# Patient Record
Sex: Male | Born: 1965 | Race: White | Hispanic: No | Smoking: Never smoker
Health system: Southern US, Community
[De-identification: ages and names within clinical notes are randomized; demographics above are authoritative.]

---

## 1998-08-24 ENCOUNTER — Encounter: Payer: Self-pay | Admitting: Emergency Medicine

## 1998-08-24 ENCOUNTER — Emergency Department (HOSPITAL_COMMUNITY): Admission: EM | Admit: 1998-08-24 | Discharge: 1998-08-24 | Payer: Self-pay | Admitting: Emergency Medicine

## 2009-06-29 ENCOUNTER — Ambulatory Visit (HOSPITAL_COMMUNITY): Admission: RE | Admit: 2009-06-29 | Discharge: 2009-06-29 | Payer: Self-pay | Admitting: General Surgery

## 2010-04-18 LAB — CBC
HCT: 47.2 % (ref 39.0–52.0)
Hemoglobin: 16 g/dL (ref 13.0–17.0)
MCHC: 34 g/dL (ref 30.0–36.0)
MCV: 88.7 fL (ref 78.0–100.0)
Platelets: 226 10*3/uL (ref 150–400)
RBC: 5.32 MIL/uL (ref 4.22–5.81)
RDW: 13.8 % (ref 11.5–15.5)
WBC: 5.7 10*3/uL (ref 4.0–10.5)

## 2010-04-18 LAB — DIFFERENTIAL
Basophils Absolute: 0 10*3/uL (ref 0.0–0.1)
Basophils Relative: 0 % (ref 0–1)
Eosinophils Absolute: 0.1 10*3/uL (ref 0.0–0.7)
Eosinophils Relative: 2 % (ref 0–5)
Lymphocytes Relative: 24 % (ref 12–46)
Lymphs Abs: 1.4 10*3/uL (ref 0.7–4.0)
Monocytes Absolute: 0.4 10*3/uL (ref 0.1–1.0)
Monocytes Relative: 8 % (ref 3–12)
Neutro Abs: 3.8 10*3/uL (ref 1.7–7.7)
Neutrophils Relative %: 66 % (ref 43–77)

## 2019-03-21 ENCOUNTER — Institutional Professional Consult (permissible substitution): Payer: Self-pay | Admitting: Internal Medicine

## 2019-04-03 ENCOUNTER — Ambulatory Visit (INDEPENDENT_AMBULATORY_CARE_PROVIDER_SITE_OTHER): Payer: 59 | Admitting: Internal Medicine

## 2019-04-03 ENCOUNTER — Other Ambulatory Visit: Payer: 59

## 2019-04-03 ENCOUNTER — Other Ambulatory Visit: Payer: Self-pay

## 2019-04-03 ENCOUNTER — Encounter: Payer: Self-pay | Admitting: Internal Medicine

## 2019-04-03 DIAGNOSIS — R05 Cough: Secondary | ICD-10-CM | POA: Diagnosis not present

## 2019-04-03 DIAGNOSIS — R0600 Dyspnea, unspecified: Secondary | ICD-10-CM | POA: Diagnosis not present

## 2019-04-03 DIAGNOSIS — R0609 Other forms of dyspnea: Secondary | ICD-10-CM

## 2019-04-03 DIAGNOSIS — R053 Chronic cough: Secondary | ICD-10-CM

## 2019-04-03 LAB — BASIC METABOLIC PANEL
BUN: 19 mg/dL (ref 6–23)
CO2: 27 mEq/L (ref 19–32)
Calcium: 9.4 mg/dL (ref 8.4–10.5)
Chloride: 105 mEq/L (ref 96–112)
Creatinine, Ser: 0.84 mg/dL (ref 0.40–1.50)
GFR: 94.76 mL/min (ref >60.00)
Glucose, Bld: 162 mg/dL — ABNORMAL HIGH (ref 70–99)
Potassium: 4.3 mEq/L (ref 3.5–5.1)
Sodium: 137 mEq/L (ref 135–145)

## 2019-04-03 LAB — BRAIN NATRIURETIC PEPTIDE: Pro B Natriuretic peptide (BNP): 25 pg/mL (ref 0.0–100.0)

## 2019-04-03 LAB — SEDIMENTATION RATE: Sed Rate: 20 mm/hr (ref 0–20)

## 2019-04-03 LAB — SARS-COV-2 IGG: SARS-COV-2 IgG: 0.02

## 2019-04-03 NOTE — Patient Instructions (Addendum)
Finish prednisone as you  plan   Change  prilosec 40mg  to where you  Take 30-60 min before first meal of the day and Pepcid ac (famotidine) 20 mg one after supper  until cough is completely gone for at least a week without the need for cough suppression  GERD (REFLUX)  is an extremely common cause of respiratory symptoms just like yours , many times with no obvious heartburn at all.    It can be treated with medication, but also with lifestyle changes including elevation of the head of your bed (ideally with 6 -8inch blocks under the headboard of your bed),  Smoking cessation, avoidance of late meals, excessive alcohol, and avoid fatty foods, chocolate, peppermint, colas, red wine, and acidic juices such as orange juice.  NO MINT OR MENTHOL PRODUCTS SO NO COUGH DROPS  USE SUGARLESS CANDY INSTEAD (Jolley ranchers or Stover's or Life Savers) or even ice chips will also do - the key is to swallow to prevent all throat clearing. NO OIL BASED VITAMINS - use powdered substitutes.  Avoid fish oil when coughing.  Go to 520 for your labs in the basement   Please schedule a follow up office visit in 2 weeks, sooner if needed

## 2019-04-03 NOTE — Progress Notes (Signed)
Travis Stewart, male    DOB: May 23, 1965,  MRN: [id number]   Brief patient profile:  54 yo male new onset nonproductive  cough mid Jan 2021 assoc with subjective wheeze and worse doe worse cough supine rx since 04/01/2019 referred to pulmonary clinic 04/03/2019 by Dr   Joneen Boers   History of Present Illness  04/03/2019  Pulmonary/ 1st office eval/Travis Stewart  Was maintained on prilosec 40 mg with breakfast  Chief Complaint  Patient presents with  . Pulmonary Consult    Referred by Dr. Smith Mince. Pt c/o DOE x 2 months. He states he coughs all the time, worse when lies down and feels sputum in his throat.   Dyspnea:  MMRC1 = can walk nl pace, flat grade, can't hurry or go uphills or steps s sob   Cough: improved on prednisone Sleep: now hob up 60 degrees recliner whereas prev sleeping flat  SABA use: no   No past medical history on file.  Outpatient Medications Prior to Visit  Medication Sig Dispense Refill  . atorvastatin (LIPITOR) 20 MG tablet Take 20 mg by mouth daily.    Marland Kitchen omeprazole (PRILOSEC) 40 MG capsule Take 40 mg by mouth daily.    . predniSONE (DELTASONE) 20 MG tablet Taper as directed        Objective:     BP 140/80 (BP Location: Left Arm, Cuff Size: Normal)   Pulse 86   Temp (!) 97.3 F (36.3 C) (Temporal)   Ht 6' (1.829 m)   Wt 262 lb (118.8 kg)   SpO2 96% Comment: on RA  BMI 35.53 kg/m   SpO2: 96 %(on RA)  amb pleasant wm nad   HEENT : pt wearing mask not removed for exam due to covid -19 concerns.    NECK :  without JVD/Nodes/TM/ nl carotid upstrokes bilaterally   LUNGS: no acc muscle use,  Nl contour chest which is clear to A and P bilaterally without cough on insp or exp maneuvers   CV:  RRR  no s3 or murmur or increase in P2, and no edema   ABD:  soft and nontender with nl inspiratory excursion in the supine position. No bruits or organomegaly appreciated, bowel sounds nl  MS:  Nl gait/ ext warm without deformities, calf tenderness, cyanosis or  clubbing No obvious joint restrictions   SKIN: warm and dry without lesions    NEURO:  alert, approp, nl sensorium with  no motor or cerebellar deficits apparent.      I personally reviewed images and agree with radiology impression as follows:  CXR:   03/18/19  Chronic interstitial changes. No focal pulmonary infiltrate, pleural effusion or pneumothorax.  The cardiomediastinal silhouette is normal in size and contour.   Labs ordered/ reviewed:      Chemistry      Component Value Date/Time   NA 137 04/03/2019 1636   K 4.3 04/03/2019 1636   CL 105 04/03/2019 1636   CO2 27 04/03/2019 1636   BUN 19 04/03/2019 1636   CREATININE 0.84 04/03/2019 1636      Component Value Date/Time   CALCIUM 9.4 04/03/2019 1636           Lab Results  Component Value Date   DDIMER 0.40 04/03/2019         Lab Results  Component Value Date   PROBNP 25.0 04/03/2019       Lab Results  Component Value Date   ESRSEDRATE 20 04/03/2019  Covid IgG   04/03/2019   = 0.20 (neg)  Assessment   DOE (dyspnea on exertion) Onset Mid Jan 2021 with cough then sob with ? ILD on cxr  - Pred rx 04/01/19 >>> clinically improved  - 04/03/2019   Walked RA x two laps =  approx 548f @ fast pace - stopped due to end of study sob   with sats of 94 % at the end of the study.  DDx for diffuse pulmonary infiltrates  Miscellaneous:Alv microlithiasis, alv proteinosis, asp, bronchiectais, BOOP (less likely with nl esr but note aleady on prednisone when obtained)   ARDS/ AIP Occupational dz/ HSP Neoplasm Infection esp viral  Drug  Pulmonary emboli, Protein disorders Edema/Eosinophilic dz (less likely with perip Eos 0.1 03/18/19 prior to pred) Sarcoidosis Connective tissue dz not yet declared ie sometimes ILD is this presenting problem Hist X / Hemorrhage Idiopathic   This would be a very unusual (rapid onset)  presentation for idiopathic fibrosis and since pt is already feeling better on prednisone (ESR  not as helpful since already  On prednisone) would be more inclined to think this a more benign and hopefully self limited inflammatory injury like we see with COVID 19 though Ab does not support this particular virus.    >>> rec f/u in 2 weeks to regroup.       Chronic cough Onset Mid Jan 2021 while on ppi with bfast daily  - 04/03/2019 rx max gerd rx / pred taper off   Of the three most common causes of  Sub-acute / recurrent or chronic cough, only one (GERD)  can actually contribute to/ trigger  the other two (asthma and post nasal drip syndrome)  and perpetuate the cylce of cough.  While not intuitively obvious, many patients with chronic low grade reflux do not cough until there is a primary insult that disturbs the protective epithelial barrier and exposes sensitive nerve endings.   This is typically viral but can due to PNDS and  either may apply here.    >>> The point is that once this occurs, it is difficult to eliminate the cycle  using anything but a maximally effective acid suppression regimen at least in the short run, accompanied by an appropriate diet to address non acid GERD and  Finish  Prednisone in case of component of Th-2 driven upper or lower airways inflammation (if cough responds short term only to relapse before return while still  on rx for uacs that might point to other issues like  allergic rhinitis/ asthma or eos bronchitis)     Each maintenance medication was reviewed in detail including emphasizing most importantly the difference between maintenance and prns and under what circumstances the prns are to be triggered using an action plan format where appropriate.  Total time for H and P, chart review, counseling,  directly observing portions of ambulatory 02 saturation study/  and generating customized AVS unique to this office visit / charting = 45 min        MChristinia Gully MD 04/03/2019

## 2019-04-04 ENCOUNTER — Encounter: Payer: Self-pay | Admitting: Internal Medicine

## 2019-04-04 DIAGNOSIS — R053 Chronic cough: Secondary | ICD-10-CM | POA: Insufficient documentation

## 2019-04-04 DIAGNOSIS — R05 Cough: Secondary | ICD-10-CM | POA: Insufficient documentation

## 2019-04-04 LAB — D-DIMER, QUANTITATIVE: D-Dimer, Quant: 0.4 mcg/mL FEU (ref <0.50)

## 2019-04-04 NOTE — Assessment & Plan Note (Signed)
Onset Mid Jan 2021 with cough then sob with ? ILD on cxr  - Pred rx 04/01/19 >>> clinically improved  - 04/03/2019   Walked RA x two laps =  approx 500ft @ fast pace - stopped due to end of study sob   with sats of 94 % at the end of the study.  DDx for diffuse pulmonary infiltrates  Miscellaneous:Alv microlithiasis, alv proteinosis, asp, bronchiectais, BOOP (less likely with nl esr but note aleady on prednisone when obtained)   ARDS/ AIP Occupational dz/ HSP Neoplasm Infection esp viral  Drug  Pulmonary emboli, Protein disorders Edema/Eosinophilic dz (less likely with perip Eos 0.1 03/18/19 prior to pred) Sarcoidosis Connective tissue dz not yet declared ie sometimes ILD is this presenting problem Hist X / Hemorrhage Idiopathic   This would be a very unusual (rapid onset)  presentation for idiopathic fibrosis and since pt is already feeling better on prednisone (ESR not as helpful since already  On prednisone) would be more inclined to think this a more benign and hopefully self limited inflammatory injury like we see with COVID 19 though Ab does not support this particular virus.    >>> rec f/u in 2 weeks to regroup.  

## 2019-04-04 NOTE — Assessment & Plan Note (Signed)
Onset Mid Jan 2021 while on ppi with bfast daily  - 04/03/2019 rx max gerd rx / pred taper off   Of the three most common causes of  Sub-acute / recurrent or chronic cough, only one (GERD)  can actually contribute to/ trigger  the other two (asthma and post nasal drip syndrome)  and perpetuate the cylce of cough.  While not intuitively obvious, many patients with chronic low grade reflux do not cough until there is a primary insult that disturbs the protective epithelial barrier and exposes sensitive nerve endings.   This is typically viral but can due to PNDS and  either may apply here.    >>> The point is that once this occurs, it is difficult to eliminate the cycle  using anything but a maximally effective acid suppression regimen at least in the short run, accompanied by an appropriate diet to address non acid GERD and  Finish  Prednisone in case of component of Th-2 driven upper or lower airways inflammation (if cough responds short term only to relapse before return while still  on rx for uacs that might point to other issues like  allergic rhinitis/ asthma or eos bronchitis)            Each maintenance medication was reviewed in detail including emphasizing most importantly the difference between maintenance and prns and under what circumstances the prns are to be triggered using an action plan format where appropriate.  Total time for H and P, chart review, counseling,  directly observing portions of ambulatory 02 saturation study/  and generating customized AVS unique to this office visit / charting = 45 min

## 2019-04-07 ENCOUNTER — Institutional Professional Consult (permissible substitution): Payer: Self-pay | Admitting: Internal Medicine

## 2019-04-18 ENCOUNTER — Ambulatory Visit (INDEPENDENT_AMBULATORY_CARE_PROVIDER_SITE_OTHER): Payer: 59

## 2019-04-18 ENCOUNTER — Ambulatory Visit (INDEPENDENT_AMBULATORY_CARE_PROVIDER_SITE_OTHER): Payer: 59 | Admitting: Internal Medicine

## 2019-04-18 ENCOUNTER — Encounter: Payer: Self-pay | Admitting: Internal Medicine

## 2019-04-18 ENCOUNTER — Other Ambulatory Visit: Payer: Self-pay

## 2019-04-18 DIAGNOSIS — R053 Chronic cough: Secondary | ICD-10-CM

## 2019-04-18 DIAGNOSIS — R05 Cough: Secondary | ICD-10-CM

## 2019-04-18 DIAGNOSIS — R0600 Dyspnea, unspecified: Secondary | ICD-10-CM | POA: Diagnosis not present

## 2019-04-18 DIAGNOSIS — R0609 Other forms of dyspnea: Secondary | ICD-10-CM

## 2019-04-18 LAB — CBC WITH DIFFERENTIAL/PLATELET
Basophils Absolute: 0 10*3/uL (ref 0.0–0.1)
Basophils Relative: 0.5 % (ref 0.0–3.0)
Eosinophils Absolute: 0.2 10*3/uL (ref 0.0–0.7)
Eosinophils Relative: 3.2 % (ref 0.0–5.0)
HCT: 43.7 % (ref 39.0–52.0)
Hemoglobin: 15.1 g/dL (ref 13.0–17.0)
Lymphocytes Relative: 36 % (ref 12.0–46.0)
Lymphs Abs: 2.3 10*3/uL (ref 0.7–4.0)
MCHC: 34.4 g/dL (ref 30.0–36.0)
MCV: 86.2 fl (ref 78.0–100.0)
Monocytes Absolute: 0.6 10*3/uL (ref 0.1–1.0)
Monocytes Relative: 8.9 % (ref 3.0–12.0)
Neutro Abs: 3.3 10*3/uL (ref 1.4–7.7)
Neutrophils Relative %: 51.4 % (ref 43.0–77.0)
Platelets: 210 10*3/uL (ref 150.0–400.0)
RBC: 5.08 Mil/uL (ref 4.22–5.81)
RDW: 13.8 % (ref 11.5–15.5)
WBC: 6.4 10*3/uL (ref 4.0–10.5)

## 2019-04-18 LAB — SEDIMENTATION RATE: Sed Rate: 20 mm/hr (ref 0–20)

## 2019-04-18 MED ORDER — OMEPRAZOLE 40 MG PO CPDR: DELAYED_RELEASE_CAPSULE | ORAL | 2 refills | Status: AC

## 2019-04-18 NOTE — Progress Notes (Signed)
Travis Stewart, male    DOB: 1965-10-29,  MRN: [id number]   Brief patient profile:  1 yowm never smoker new onset nonproductive  cough mid Jan 2021 assoc with subjective wheeze and worse doe worse cough supine rx since 04/01/2019 referred to pulmonary clinic 04/03/2019 by Dr   [name]   History of Present Illness  04/03/2019  Pulmonary/ 1st office eval/Travis Stewart  Was maintained on prilosec 40 mg with breakfast  Chief Complaint  Patient presents with  . Pulmonary Consult    Referred by Dr. [name]. Pt c/o DOE x 2 months. He states he coughs all the time, worse when lies down and feels sputum in his throat.   Dyspnea:  MMRC1 = can walk nl pace, flat grade, can't hurry or go uphills or steps s sob   Cough: improved on prednisone Sleep: now hob up 60 degrees recliner whereas prev sleeping flat  SABA use: no  rec Finish prednisone as you  plan  Change  prilosec 40mg  to where you  Take 30-60 min before first meal of the day and Pepcid ac (famotidine) 20 mg one after supper  until cough is completely gone for at least a week without the need for cough suppression GERD  Diet   04/18/2019  f/u ov/Travis Stewart re: cough x jan 2021 / off pred x one week, no flare  Chief Complaint  Patient presents with  . Follow-up    cough is improved since 3/4 OV, does note occasional throat clearing.  Denies any DOE.    Dyspnea:  Not limited by breathing from desired activities  / very sedentary  Cough: better but still some dry cough mostly after supper then hs the worst with sense of ongoing tickle/ urge to clear throat  Sleeping: sometimes wakes up /supine with one pillow  SABA use: none 02: none      No obvious day to day or daytime variability or assoc excess/ purulent sputum or mucus plugs or hemoptysis or cp or chest tightness, subjective wheeze or overt sinus or hb symptoms.    Also denies any obvious fluctuation of symptoms with weather or environmental changes or other aggravating or alleviating  factors except as outlined above   No unusual exposure hx or h/o childhood pna/ asthma or knowledge of premature birth.  Current Allergies, Complete Past Medical History, Past Surgical History, Family History, and Social History were reviewed in Feb 2021 record.  ROS  The following are not active complaints unless bolded Hoarseness, sore throat, dysphagia, dental problems, itching, sneezing,  nasal congestion or discharge of excess mucus or purulent secretions, ear ache,   fever, chills, sweats, unintended wt loss or wt gain, classically pleuritic or exertional cp,  orthopnea pnd or arm/hand swelling  or leg swelling, presyncope, palpitations, abdominal pain, anorexia, nausea, vomiting, diarrhea  or change in bowel habits or change in bladder habits, change in stools or change in urine, dysuria, hematuria,  rash, arthralgias, visual complaints, headache, numbness, weakness or ataxia or problems with walking or coordination,  change in mood or  memory.        Current Meds  Medication Sig  . atorvastatin (LIPITOR) 20 MG tablet Take 20 mg by mouth daily.  . Multiple Vitamin (ONE-A-DAY MENS PO) Take 1 tablet by mouth daily.  [employer] omeprazole (PRILOSEC) 40 MG capsule Take 40 mg by mouth daily.             Objective:      amb mod obese  wm nad   Wt Readings from Last 3 Encounters:  04/18/19 258 lb 14.4 oz (117.4 kg)  04/03/19 262 lb (118.8 kg)     Vital signs reviewed - Note on arrival 02 sats  98% on RA    HEENT : pt wearing mask not removed for exam due to covid -19 concerns.    NECK :  without JVD/Nodes/TM/ nl carotid upstrokes bilaterally   LUNGS: no acc muscle use,  Nl contour chest which is clear to A and P bilaterally without cough on insp or exp maneuvers   CV:  RRR  no s3 or murmur or increase in P2, and no edema   ABD:  soft and nontender with nl inspiratory excursion in the supine position. No bruits or organomegaly appreciated, bowel sounds  nl  MS:  Nl gait/ ext warm without deformities, calf tenderness, cyanosis or clubbing No obvious joint restrictions   SKIN: warm and dry without lesions    NEURO:  alert, approp, nl sensorium with  no motor or cerebellar deficits apparent.            CXR PA and Lateral:   04/18/2019 :    I personally reviewed images and agree with radiology impression as follows:    No active cardiopulmonary disease. My comment:  Relatively small lung volumes likely due to body habitus   Labs ordered 04/18/2019  :  allergy profile          Lab Results  Component Value Date   ESRSEDRATE 20 04/18/2019   ESRSEDRATE 20 04/03/2019      Assessment

## 2019-04-18 NOTE — Patient Instructions (Addendum)
Increase the prilosec (omeprazole) to Take 30- 60 min before your first and last meals of the day   For drainage / throat tickle try take CHLORPHENIRAMINE  4 mg  (Chlortab 4mg   at should be easiest to find in the green box)  take one every 4 hours as needed - available over the counter- may cause drowsiness so start with just a  dose or two an hour before  and see how you tolerate it before trying in daytime    Please remember to go to the lab and x-ray department   for your tests - we will call you with the results when they are available.     Please schedule a follow up office visit in 4 weeks, sooner if needed

## 2019-04-19 ENCOUNTER — Encounter: Payer: Self-pay | Admitting: Internal Medicine

## 2019-04-19 NOTE — Assessment & Plan Note (Signed)
Onset Mid Jan 2021 while on ppi with bfast daily  - 04/03/2019 rx max gerd rx / pred taper off  - Allergy profile 04/18/2019 >  Eos 0.2 /  IgE   Did seem to improve with steroids but still persists esp p supper but settles down overnight typical of Upper airway cough syndrome (previously labeled PNDS),  is so named because it's frequently impossible to sort out how much is  CR/sinusitis with freq throat clearing (which can be related to primary GERD)   vs  causing  secondary (" extra esophageal")  GERD from wide swings in gastric pressure that occur with throat clearing, often  promoting self use of mint and menthol lozenges that reduce the lower esophageal sphincter tone and exacerbate the problem further in a cyclical fashion.   These are the same pts (now being labeled as having "irritable larynx syndrome" by some cough centers) who not infrequently have a history of having failed to tolerate ace inhibitors,  dry powder inhalers or biphosphonates or report having atypical/extraesophageal reflux symptoms that don't respond to standard doses of PPI  and are easily confused as having aecopd or asthma flares by even experienced allergists/ pulmonologists (myself included).   >>> rec max gerd rx/ bed blocks/ 1st gen H1 blockers per guidelines  And f/u in 4 weeks.

## 2019-04-19 NOTE — Assessment & Plan Note (Addendum)
Onset Mid Jan 2021 with cough then sob with ? ILD on cxr  - Pred rx 04/01/19 >>> clinically improved  - 04/03/19 covid Antibodies neg  - 04/03/2019   Walked RA x two laps =  approx 54ft @ fast pace - stopped due to end of study sob   with sats of 94 % at the end of the study - cxr 04/18/2019 no evidence of ild, just low lung vol likely related to body habitus  No evidence of ILD so rec resume regular ex, repeat walking sats at f/u ov if not improving with ex/ wt loss encouraged          Each maintenance medication was reviewed in detail including emphasizing most importantly the difference between maintenance and prns and under what circumstances the prns are to be triggered using an action plan format where appropriate.  Total time for H and P, chart review, counseling,   and generating customized AVS unique to this office visit with moderate MDM / charting = 30 min

## 2019-04-21 LAB — IGE: IgE (Immunoglobulin E), Serum: 46 kU/L (ref <=114)

## 2019-04-21 NOTE — Progress Notes (Signed)
Left detailed msg ok per DPR

## 2019-05-16 ENCOUNTER — Ambulatory Visit (INDEPENDENT_AMBULATORY_CARE_PROVIDER_SITE_OTHER): Payer: 59 | Admitting: Internal Medicine

## 2019-05-16 ENCOUNTER — Other Ambulatory Visit: Payer: Self-pay

## 2019-05-16 ENCOUNTER — Encounter: Payer: Self-pay | Admitting: Internal Medicine

## 2019-05-16 DIAGNOSIS — R0600 Dyspnea, unspecified: Secondary | ICD-10-CM

## 2019-05-16 DIAGNOSIS — R05 Cough: Secondary | ICD-10-CM | POA: Diagnosis not present

## 2019-05-16 DIAGNOSIS — R0609 Other forms of dyspnea: Secondary | ICD-10-CM

## 2019-05-16 DIAGNOSIS — R053 Chronic cough: Secondary | ICD-10-CM

## 2019-05-16 NOTE — Progress Notes (Signed)
Travis Stewart, male    DOB: 11/18/1965,  MRN: [id number]   Brief patient profile:  15 yowm never smoker new onset nonproductive  cough mid Jan 2021 assoc with subjective wheeze and worse doe worse cough supine rx since 04/01/2019 referred to pulmonary clinic 04/03/2019 by Dr   Joneen Boers   History of Present Illness  04/03/2019  Pulmonary/ 1st office eval/Sabeen Piechocki  Was maintained on prilosec 40 mg with breakfast  Chief Complaint  Patient presents with  . Pulmonary Consult    Referred by Dr. Smith Mince. Pt c/o DOE x 2 months. He states he coughs all the time, worse when lies down and feels sputum in his throat.   Dyspnea:  MMRC1 = can walk nl pace, flat grade, can't hurry or go uphills or steps s sob   Cough: improved on prednisone Sleep: now hob up 60 degrees recliner whereas prev sleeping flat  SABA use: no  rec Finish prednisone as you  plan  Change  prilosec 40mg  to where you  Take 30-60 min before first meal of the day and Pepcid ac (famotidine) 20 mg one after supper  until cough is completely gone for at least a week without the need for cough suppression GERD  Diet   04/18/2019  f/u ov/Peaches Vanoverbeke re: cough x jan 2021 / off pred x one week, no flare  Chief Complaint  Patient presents with  . Follow-up    cough is improved since 3/4 OV, does note occasional throat clearing.  Denies any DOE.    Dyspnea:  Not limited by breathing from desired activities  / very sedentary  Cough: better but still some dry cough mostly after supper then hs the worst with sense of ongoing tickle/ urge to clear throat  Sleeping: sometimes wakes up /supine with one pillow  SABA use: none 02: none  rec Increase the prilosec (omeprazole) to Take 30- 60 min before your first and last meals of the day  For drainage / throat tickle try take CHLORPHENIRAMINE  4 mg  (Chlortab 4mg   at [employer] should be easiest to find in the green box)  take one every 4 hours as needed - available over the counter- may  cause drowsiness so start with just a  dose or two an hour before  and see how you tolerate it before trying in daytime     .    05/16/2019  f/u ov/Leyana Whidden re: cough onset Jan 2021 almost completely gone  Chief Complaint  Patient presents with  . Follow-up    Cough has improved some. No new co's. Would like to discuss the last cxr.   Dyspnea:  Not very active at all but no limiting sob Cough: much better with ppi bid and chlorpheniramine hs  Sleeping: on recliner for back  SABA use: none 02: none     No obvious day to day or daytime variability or assoc excess/ purulent sputum or mucus plugs or hemoptysis or cp or chest tightness, subjective wheeze or overt sinus or hb symptoms.   Sleeping as above without nocturnal  or early am exacerbation  of respiratory  c/o's or need for noct saba. Also denies any obvious fluctuation of symptoms with weather or environmental changes or other aggravating or alleviating factors except as outlined above   No unusual exposure hx or h/o childhood pna/ asthma or knowledge of premature birth.  Current Allergies, Complete Past Medical History, Past Surgical History, Family History, and Social History were reviewed in  [facility] electronic medical record.  ROS  The following are not active complaints unless bolded Hoarseness, sore throat, dysphagia, dental problems, itching, sneezing,  nasal congestion or discharge of excess mucus or purulent secretions, ear ache,   fever, chills, sweats, unintended wt loss or wt gain, classically pleuritic or exertional cp,  orthopnea pnd or arm/hand swelling  or leg swelling, presyncope, palpitations, abdominal pain, anorexia, nausea, vomiting, diarrhea  or change in bowel habits or change in bladder habits, change in stools or change in urine, dysuria, hematuria,  rash, arthralgias/back pain , visual complaints, headache, numbness, weakness or ataxia or problems with walking or coordination,  change in mood or  memory.         Current Meds  Medication Sig  . atorvastatin (LIPITOR) 20 MG tablet Take 20 mg by mouth daily.  . Multiple Vitamin (ONE-A-DAY MENS PO) Take 1 tablet by mouth daily.  Marland Kitchen omeprazole (PRILOSEC) 40 MG capsule Take 30- 60 min before your first and last meals of the day                Objective:          05/16/2019         04/18/19 258 lb 14.4 oz (117.4 kg)  04/03/19 262 lb (118.8 kg)      amb wm nad  Vital signs reviewed  05/16/2019  - Note at rest 02 sats  97% on RA   HEENT : pt wearing mask not removed for exam due to covid -19 concerns.    NECK :  without JVD/Nodes/TM/ nl carotid upstrokes bilaterally   LUNGS: no acc muscle use,  Nl contour chest which is clear to A and P bilaterally without cough on insp or exp maneuvers   CV:  RRR  no s3 or murmur or increase in P2, and no edema   ABD:  soft and nontender with nl inspiratory excursion in the supine position. No bruits or organomegaly appreciated, bowel sounds nl  MS:  Nl gait/ ext warm without deformities, calf tenderness, cyanosis or clubbing No obvious joint restrictions   SKIN: warm and dry without lesions    NEURO:  alert, approp, nl sensorium with  no motor or cerebellar deficits apparent.              Assessment

## 2019-05-16 NOTE — Patient Instructions (Addendum)
.  Make sure you check your oxygen saturations at highest level of activity to be sure it stays well over 90% and call if losing ground with your exercise tolerance or your 02 saturations   Please schedule a follow up visit in 3 months but call sooner if needed with cxr

## 2019-05-17 ENCOUNTER — Encounter: Payer: Self-pay | Admitting: Internal Medicine

## 2019-05-17 NOTE — Assessment & Plan Note (Addendum)
Onset Mid Jan 2021 with cough then sob with ? ILD on cxr  - Pred rx 04/01/19 >>> clinically improved  - 04/03/19 covid Antibodies neg  - 04/03/2019   Walked RA x two laps =  approx 538ft @ fast pace - stopped due to end of study sob   with sats of 94 % at the end of the study - cxr 04/18/2019 no evidence of ild, just low lung vol likely related to body habitus  Reviewed cxr with pt > does show decreased lung vol but no convincing ild > rec Make sure you check your oxygen saturations at highest level of activity to be sure it stays over 90% and trend levels over next 3 months then return for  final ov with cxr and walking sats for apples to apples comparison

## 2019-05-17 NOTE — Assessment & Plan Note (Addendum)
Onset Mid Jan 2021 while on ppi with bfast daily  - 04/03/2019 rx max gerd rx / pred taper off  - Allergy profile 04/18/2019 >  Eos 0.2 /  IgE 46  rec ok to wean ppi back to q am ac and see if any flare and if so resume bid ac x next 3 months then return to regroup / wt loss and diet reviewed          Each maintenance medication was reviewed in detail including emphasizing most importantly the difference between maintenance and prns and under what circumstances the prns are to be triggered using an action plan format where appropriate.  Total time for H and P, chart review, counseling, teaching device and generating customized AVS unique to this office visit / charting = 20 min

## 2019-08-15 ENCOUNTER — Other Ambulatory Visit: Payer: Self-pay

## 2019-08-15 ENCOUNTER — Encounter: Payer: Self-pay | Admitting: Internal Medicine

## 2019-08-15 ENCOUNTER — Ambulatory Visit (INDEPENDENT_AMBULATORY_CARE_PROVIDER_SITE_OTHER): Payer: 59 | Admitting: Internal Medicine

## 2019-08-15 ENCOUNTER — Ambulatory Visit (INDEPENDENT_AMBULATORY_CARE_PROVIDER_SITE_OTHER)
Admission: RE | Admit: 2019-08-15 | Discharge: 2019-08-15 | Disposition: A | Discharge status: Home / Self Care | Payer: 59 | Source: Ambulatory Visit | Attending: Internal Medicine | Admitting: Internal Medicine

## 2019-08-15 DIAGNOSIS — R0600 Dyspnea, unspecified: Secondary | ICD-10-CM

## 2019-08-15 DIAGNOSIS — R0609 Other forms of dyspnea: Secondary | ICD-10-CM

## 2019-08-15 DIAGNOSIS — R05 Cough: Secondary | ICD-10-CM | POA: Diagnosis not present

## 2019-08-15 DIAGNOSIS — R053 Chronic cough: Secondary | ICD-10-CM

## 2019-08-15 MED ORDER — TRAMADOL HCL 50 MG PO TABS: 50.0000 mg | ORAL_TABLET | ORAL | 0 refills | Status: AC | PRN

## 2019-08-15 MED ORDER — PREDNISONE 10 MG PO TABS: ORAL_TABLET | ORAL | 0 refills | Status: DC

## 2019-08-15 NOTE — Progress Notes (Signed)
Travis Stewart, male    DOB: [birth year],  MRN: [id number]   Brief patient profile:  18 yowm never smoker new onset nonproductive  cough mid Jan 2021 assoc with subjective wheeze and worse doe worse cough supine rx since 04/01/2019 referred to pulmonary clinic 04/03/2019 by Dr   [name]   History of Present Illness  04/03/2019  Pulmonary/ 1st office eval/Travis Stewart  Was maintained on prilosec 40 mg with breakfast  Chief Complaint  Patient presents with  . Pulmonary Consult    Referred by Dr. [name]. Pt c/o DOE x 2 months. He states he coughs all the time, worse when lies down and feels sputum in his throat.   Dyspnea:  MMRC1 = can walk nl pace, flat grade, can't hurry or go uphills or steps s sob   Cough: improved on prednisone Sleep: now hob up 60 degrees recliner whereas prev sleeping flat  SABA use: no  rec Finish prednisone as you  plan  Change  prilosec 40mg  to where you  Take 30-60 min before first meal of the day and Pepcid ac (famotidine) 20 mg one after supper  until cough is completely gone for at least a week without the need for cough suppression GERD  Diet   04/18/2019  f/u ov/Travis Stewart re: cough x jan 2021 / off pred x one week, no flare  Chief Complaint  Patient presents with  . Follow-up    cough is improved since 3/4 OV, does note occasional throat clearing.  Denies any DOE.    Dyspnea:  Not limited by breathing from desired activities  / very sedentary  Cough: better but still some dry cough mostly after supper then hs the worst with sense of ongoing tickle/ urge to clear throat  Sleeping: sometimes wakes up /supine with one pillow  SABA use: none 02: none  rec Increase the prilosec (omeprazole) to Take 30- 60 min before your first and last meals of the day  For drainage / throat tickle try take CHLORPHENIRAMINE  4 mg  (Chlortab 4mg   at Feb 2021    .    05/16/2019  f/u ov/Travis Stewart re: cough onset Jan 2021 almost completely gone  Chief Complaint  Patient  presents with  . Follow-up    Cough has improved some. No new co's. Would like to discuss the last cxr.   Dyspnea:  Not very active at all but no limiting sob Cough: much better with ppi bid and chlorpheniramine hs  Sleeping: on recliner for back  SABA use: none 02: none  rec Make sure you check your oxygen saturations at highest level of activity to be sure it stays well over 90% and call if losing ground with your exercise tolerance or your 02 saturations    08/15/2019  f/u ov/Travis Stewart re:  maint omeprazole 40 mg bid ac but not h1 / sedentary but no sob / vully vaccinated for covid 19  Chief Complaint  Patient presents with  . Follow-up  around 5/11/2021cough flared where had improved some and daily since   Dyspnea:  Not limited by breathing from desired activities  / not checking sats as rec  Cough: non productive Sleeping:worse cough at hs and keeps him up despite 30 degrees elevation hob / not using 1st gen H1 blockers per guidelines  / recs  SABA use: none  02: none  One course prednisone completed 5/29 much better then some subjective wheeze  Not aware of pnds / no gag/vomiting  No obvious day to day or daytime variability or assoc excess/ purulent sputum or mucus plugs or hemoptysis or cp or chest tightness,  or overt sinus or hb symptoms.    Also denies any obvious fluctuation of symptoms with weather or environmental changes or other aggravating or alleviating factors except as outlined above   No unusual exposure hx or h/o childhood pna/ asthma or knowledge of premature birth.  Current Allergies, Complete Past Medical History, Past Surgical History, Family History, and Social History were reviewed in [employer] record.  ROS  The following are not active complaints unless bolded Hoarseness, sore throat, dysphagia, dental problems, itching, sneezing,  nasal congestion or discharge of excess mucus or purulent secretions, ear ache,   fever, chills,  sweats, unintended wt loss or wt gain, classically pleuritic or exertional cp,  orthopnea pnd or arm/hand swelling  or leg swelling, presyncope, palpitations, abdominal pain, anorexia, nausea, vomiting, diarrhea  or change in bowel habits or change in bladder habits, change in stools or change in urine, dysuria, hematuria,  rash, arthralgias, visual complaints, headache, numbness, weakness or ataxia or problems with walking or coordination,  change in mood or  memory.        Current Meds  Medication Sig  . atorvastatin (LIPITOR) 20 MG tablet Take 20 mg by mouth daily.  . Multiple Vitamin (ONE-A-DAY MENS PO) Take 1 tablet by mouth daily.  Marland Kitchen omeprazole (PRILOSEC) 40 MG capsule Take 30- 60 min before your first and last meals of the day            Objective:     amb wm vigorous throat clearing     08/15/2019        264        04/18/19 258 lb 14.4 oz (117.4 kg)  04/03/19 262 lb (118.8 kg)    Vital signs reviewed  08/15/2019  - Note at rest 02 sats  99% on RA     HEENT : pt wearing mask not removed for exam due to covid -19 concerns.    NECK :  without JVD/Nodes/TM/ nl carotid upstrokes bilaterally   LUNGS: no acc muscle use,  Nl contour chest which is clear to A and P bilaterally without cough on insp or exp maneuvers   CV:  RRR  no s3 or murmur or increase in P2, and no edema   ABD:  soft and nontender with nl inspiratory excursion in the supine position. No bruits or organomegaly appreciated, bowel sounds nl  MS:  Nl gait/ ext warm without deformities, calf tenderness, cyanosis or clubbing No obvious joint restrictions   SKIN: warm and dry without lesions    NEURO:  alert, approp, nl sensorium with  no motor or cerebellar deficits apparent.        CXR PA and Lateral:   08/15/2019 :    I personally reviewed images and   impression as follows:   slt decreased lung volumes/ no acute ahnges         Assessment

## 2019-08-15 NOTE — Patient Instructions (Addendum)
Make sure you check your oxygen saturations at highest level of activity to be sure it stays over 90% and check once a week  The key to effective treatment for your cough is eliminating the non-stop cycle of cough you're stuck in long enough to let your airway heal completely and then see if there is anything still making you cough once you stop the cough suppression, but this should take no more than 5 days to figure out  First take delsym two tsp every 12 hours and supplement if needed with  tramadol 50 mg up to 1-2 every 4 hours to suppress the urge to cough at all or even clear your throat. Swallowing water or using ice chips/non mint and menthol containing candies (such as lifesavers or sugarless jolly ranchers) are also effective.  You should rest your voice and avoid activities that you know make you cough.  For drainage / throat tickle try take CHLORPHENIRAMINE  4 mg  (Chlortab 4mg   at should be easiest to find in the green box)  take one every 4 hours as needed - available over the counter- may cause drowsiness so start with just a bedtime dose or two and see how you tolerate it before trying in daytime    Once you have eliminated the cough for 3 straight days try reducing the tramadol first,  then the delsym as tolerated.    Prednisone 10 mg take  4 each am x 2 days,   2 each am x 2 days,  1 each am x 2 days and stop (this is to eliminate allergies and inflammation from coughing)   GERD (REFLUX)  is an extremely common cause of respiratory symptoms, many times with no significant heartburn at all.    It can be treated with medication, but also with lifestyle changes including avoidance of late meals, excessive alcohol, smoking cessation, and avoid fatty foods, chocolate, peppermint, colas, red wine, and acidic juices such as orange juice.  NO MINT OR MENTHOL PRODUCTS SO NO COUGH DROPS   USE HARD CANDY INSTEAD (jolley ranchers or Stover's or Lifesavers (all available in  sugarless versions) NO OIL BASED VITAMINS - use powdered substitutes.  Please remember to go to the   x-ray department ([facility])  for your tests - we will call you with the results when they are available.   Please schedule a follow up office visit in 6 weeks, call sooner if needed

## 2019-08-16 ENCOUNTER — Encounter: Payer: Self-pay | Admitting: Internal Medicine

## 2019-08-16 NOTE — Assessment & Plan Note (Signed)
Onset Mid Jan 2021 with cough then sob with ? ILD on cxr  - Pred rx 04/01/19 >>> clinically improved  - 04/03/19 covid Antibodies neg  - 04/03/2019   Walked RA x two laps =  approx 570ft @ fast pace - stopped due to end of study sob   with sats of 94 % at the end of the study - cxr 04/18/2019 no evidence of ild, just low lung vol likely related to body habitus -  08/15/2019   Walked RA  3 laps @ approx 272ft each @ fast pace  stopped due to end of study,  no sob sats 98%   No evidence at all of ILD or any other primary lung dz

## 2019-08-16 NOTE — Assessment & Plan Note (Signed)
Onset Mid Jan 2021 while on ppi with bfast daily  - 04/03/2019 rx max gerd rx / pred taper off  - Allergy profile 04/18/2019 >  Eos 0.2 /  IgE 46 - worse since 06/10/19 > try cyclical cough protocol  Of the three most common causes of  Sub-acute / recurrent or chronic cough, only one (GERD)  can actually contribute to/ trigger  the other two (asthma and post nasal drip syndrome)  and perpetuate the cylce of cough.  While not intuitively obvious, many patients with chronic low grade reflux do not cough until there is a primary insult that disturbs the protective epithelial barrier and exposes sensitive nerve endings.   This is typically viral but can due to PNDS and  either may apply here.   The point is that once this occurs, it is difficult to eliminate the cycle  using anything but a maximally effective acid suppression regimen at least in the short run, accompanied by an appropriate diet to address non acid GERD and control / eliminate the cough itself for at least 3 days.with tramadol and  Also added 6 days of Prednisone in case of component of Th-2 driven upper or lower airways inflammation (if cough responds short term only to relapse befor return while will on rx for uacs that would point to allergic rhinitis/ asthma or eos bronchitis)     >>> f/u in 6 weeks, call sooner if needed           Each maintenance medication was reviewed in detail including emphasizing most importantly the difference between maintenance and prns and under what circumstances the prns are to be triggered using an action plan format where appropriate.  Total time for H and P, chart review, counseling,  directly observing portions of ambulatory 02 saturation study/ and generating customized AVS unique to this office visit / charting = 30 min

## 2019-08-19 NOTE — Progress Notes (Signed)
Left detailed msg on machine ok per DPR

## 2019-09-01 ENCOUNTER — Telehealth: Payer: Self-pay | Admitting: Internal Medicine

## 2019-09-01 MED ORDER — PREDNISONE 10 MG PO TABS: ORAL_TABLET | ORAL | 0 refills | Status: AC

## 2019-09-01 NOTE — Telephone Encounter (Signed)
Spoke with patient and provided Dr. Thurston Hole recommendations.  Verified pharmacy.  Patient verbalized understanding, nothing further needed.

## 2019-09-01 NOTE — Telephone Encounter (Signed)
Ok to do Prednisone 10 mg take  4 each am x 2 days,   2 each am x 2 days,  1 each am x 2 days and stop but if not 100% back to nl needs to move up f/u ov with all meds in hand to regroup

## 2019-09-01 NOTE — Telephone Encounter (Signed)
Pt called back, please return call  

## 2019-09-01 NOTE — Telephone Encounter (Signed)
Spoke with patient, he reports some chest congestion that started 3-4 days ago, started clearing his throat over the weekend which he had not been having to do.  He is not coughing anything up.  He states he saw Dr. Sherene Sires recently and was put on a steroid and it cleared it up right away.  He states Dr. Sherene Sires did tell him at that time he had probably waited too long.  He says this just started, but did not want to wait too long.  Dr. Sherene Sires, please advise.  Thank you.

## 2019-09-01 NOTE — Telephone Encounter (Signed)
Called and had to leave a vm.

## 2019-10-03 ENCOUNTER — Ambulatory Visit: Payer: 59 | Admitting: Internal Medicine

## 2019-10-03 ENCOUNTER — Other Ambulatory Visit: Payer: Self-pay

## 2019-10-03 ENCOUNTER — Ambulatory Visit (INDEPENDENT_AMBULATORY_CARE_PROVIDER_SITE_OTHER): Payer: 59 | Admitting: Internal Medicine

## 2019-10-03 ENCOUNTER — Encounter: Payer: Self-pay | Admitting: Internal Medicine

## 2019-10-03 DIAGNOSIS — R05 Cough: Secondary | ICD-10-CM | POA: Diagnosis not present

## 2019-10-03 DIAGNOSIS — R053 Chronic cough: Secondary | ICD-10-CM

## 2019-10-03 LAB — CBC WITH DIFFERENTIAL/PLATELET
Basophils Absolute: 0 10*3/uL (ref 0.0–0.1)
Basophils Relative: 0.4 % (ref 0.0–3.0)
Eosinophils Absolute: 0.2 10*3/uL (ref 0.0–0.7)
Eosinophils Relative: 2.5 % (ref 0.0–5.0)
HCT: 44.2 % (ref 39.0–52.0)
Hemoglobin: 15.3 g/dL (ref 13.0–17.0)
Lymphocytes Relative: 29.4 % (ref 12.0–46.0)
Lymphs Abs: 2.1 10*3/uL (ref 0.7–4.0)
MCHC: 34.7 g/dL (ref 30.0–36.0)
MCV: 86.1 fl (ref 78.0–100.0)
Monocytes Absolute: 0.6 10*3/uL (ref 0.1–1.0)
Monocytes Relative: 9.2 % (ref 3.0–12.0)
Neutro Abs: 4.1 10*3/uL (ref 1.4–7.7)
Neutrophils Relative %: 58.5 % (ref 43.0–77.0)
Platelets: 222 10*3/uL (ref 150.0–400.0)
RBC: 5.13 Mil/uL (ref 4.22–5.81)
RDW: 13.9 % (ref 11.5–15.5)
WBC: 7 10*3/uL (ref 4.0–10.5)

## 2019-10-03 MED ORDER — PANTOPRAZOLE SODIUM 40 MG PO TBEC: 40.0000 mg | DELAYED_RELEASE_TABLET | Freq: Every day | ORAL | 2 refills | Status: DC

## 2019-10-03 MED ORDER — GABAPENTIN 100 MG PO CAPS: 100.0000 mg | ORAL_CAPSULE | Freq: Three times a day (TID) | ORAL | 2 refills | Status: DC

## 2019-10-03 MED ORDER — PREDNISONE 10 MG PO TABS: ORAL_TABLET | ORAL | 0 refills | Status: DC

## 2019-10-03 NOTE — Assessment & Plan Note (Signed)
Onset Mid Jan 2021 while on ppi with bfast daily  - 04/03/2019 rx max gerd rx / pred taper off  - Allergy profile 04/18/2019 >  Eos 0.2 /  IgE 46 - worse since 06/10/19 > try cyclical cough protocol > temporary relief only  - 10/03/2019  :  allergy profile off pred x one month   Of the three most common causes of  Sub-acute / recurrent or chronic cough, only one (GERD)  can actually contribute to/ trigger  the other two (asthma and post nasal drip syndrome)  and perpetuate the cylce of cough.  While not intuitively obvious, many patients with chronic low grade reflux do not cough until there is a primary insult that disturbs the protective epithelial barrier and exposes sensitive nerve endings.   This is typically viral but can due to PNDS and  either may apply here.    >>> The point is that once this occurs, it is difficult to eliminate the cycle  using anything but a maximally effective acid suppression regimen at least in the short run, accompanied by an appropriate diet to address non acid GERD and control / eliminate the cough itself with gabapentin 100 mg tid to titrate as high as 300 mg qid or refer to [facility]/ Dr Delford Field  Also add back h1 hs and also   6 days of Prednisone in case of component of Th-2 driven upper or lower airways inflammation (if cough responds short term only to relapse befor return while will on rx for uacs that would point to allergic rhinitis/ asthma or eos bronchitis)           Each maintenance medication was reviewed in detail including emphasizing most importantly the difference between maintenance and prns and under what circumstances the prns are to be triggered using an action plan format where appropriate.  Total time for H and P, chart review, counseling, teaching device and generating customized AVS unique to this office visit / charting = 36 min

## 2019-10-03 NOTE — Progress Notes (Deleted)
Travis Stewart, male    DOB: [birth year],  MRN: [id number]   Brief patient profile:  1 yowm never smoker new onset nonproductive  cough mid Jan 2021 assoc with subjective wheeze and worse doe assic  worse cough supine rx since 04/01/2019 referred to pulmonary clinic 04/03/2019 by Dr   [name]   History of Present Illness  04/03/2019  Pulmonary/ 1st office eval/Tinea Nobile  Was maintained on prilosec 40 mg with breakfast  Chief Complaint  Patient presents with  . Pulmonary Consult    Referred by Dr. [name]. Pt c/o DOE x 2 months. He states he coughs all the time, worse when lies down and feels sputum in his throat.   Dyspnea:  MMRC1 = can walk nl pace, flat grade, can't hurry or go uphills or steps s sob   Cough: improved on prednisone Sleep: now hob up 60 degrees recliner whereas prev sleeping flat  SABA use: no  rec Finish prednisone as you  plan  Change  prilosec 40mg  to where you  Take 30-60 min before first meal of the day and Pepcid ac (famotidine) 20 mg one after supper  until cough is completely gone for at least a week without the need for cough suppression GERD  Diet   04/18/2019  f/u ov/[name] re: cough x jan 2021 / off pred x one week, no flare  Chief Complaint  Patient presents with  . Follow-up    cough is improved since 3/4 OV, does note occasional throat clearing.  Denies any DOE.    Dyspnea:  Not limited by breathing from desired activities  / very sedentary  Cough: better but still some dry cough mostly after supper then hs the worst with sense of ongoing tickle/ urge to clear throat  Sleeping: sometimes wakes up /supine with one pillow  SABA use: none 02: none  rec Increase the prilosec (omeprazole) to Take 30- 60 min before your first and last meals of the day  For drainage / throat tickle try take CHLORPHENIRAMINE  4 mg  (Chlortab 4mg   at Feb 2021     05/16/2019  f/u ov/Dink Creps re: cough onset Jan 2021 almost completely gone  Chief Complaint  Patient  presents with  . Follow-up    Cough has improved some. No new co's. Would like to discuss the last cxr.   Dyspnea:  Not very active at all but no limiting sob Cough: much better with ppi bid and chlorpheniramine hs  Sleeping: on recliner for back  SABA use: none 02: none  rec Make sure you check your oxygen saturations at highest level of activity to be sure it stays well over 90% and call if losing ground with your exercise tolerance or your 02 saturations    08/15/2019  f/u ov/[name] re:  maint omeprazole 40 mg bid ac but not h1 / sedentary but no sob / fully vaccinated for covid 19  Chief Complaint  Patient presents with  . Follow-up  around 5/11/2021cough flared where had improved some and daily since   Dyspnea:  Not limited by breathing from desired activities  / not checking sats as rec  Cough: non productive Sleeping:worse cough at hs and keeps him up despite 30 degrees elevation hob / not using 1st gen H1 blockers per guidelines  / recs  SABA use: none  02: none  One course prednisone completed 5/29 much better then some subjective wheeze  Not aware of pnds / no gag/vomiting  rec Make sure  you check your oxygen saturations at highest level of activity to be sure it stays over 90% and check once a week First take delsym two tsp every 12 hours and supplement if needed with  tramadol 50 mg up to 1-2 every 4 hours to suppress the urge to cough at all or even clear your throat. Swallowing water or using ice chips/non mint and menthol containing candies (such as lifesavers or sugarless jolly ranchers) are also effective.  You should rest your voice and avoid activities that you know make you cough. For drainage / throat tickle try take CHLORPHENIRAMINE  4 mg  (Chlortab 4mg   at should be easiest to find in the green box)  take one every 4 hours as needed - available over the counter- may cause drowsiness so start with just a bedtime dose or two and see how you tolerate it  before trying in daytime   Once you have eliminated the cough for 3 straight days try reducing the tramadol first,  then the delsym as tolerated.   Prednisone 10 mg take  4 each am x 2 days,   2 each am x 2 days,  1 each am x 2 days and stop (this is to eliminate allergies and inflammation from coughing)  GERD diet  Please remember to go to the   x-ray department ([facility])  for your tests - we will call you with the results when they are available   10/03/2019  f/u ov/[name] re:  No chief complaint on file.    Dyspnea:  *** Cough: *** Sleeping: *** SABA use: *** 02: ***   No obvious day to day or daytime variability or assoc excess/ purulent sputum or mucus plugs or hemoptysis or cp or chest tightness, subjective wheeze or overt sinus or hb symptoms.   *** without nocturnal  or early am exacerbation  of respiratory  c/o's or need for noct saba. Also denies any obvious fluctuation of symptoms with weather or environmental changes or other aggravating or alleviating factors except as outlined above   No unusual exposure hx or h/o childhood pna/ asthma or knowledge of premature birth.  Current Allergies, Complete Past Medical History, Past Surgical History, Family History, and Social History were reviewed in 12/03/2019 record.  ROS  The following are not active complaints unless bolded Hoarseness, sore throat, dysphagia, dental problems, itching, sneezing,  nasal congestion or discharge of excess mucus or purulent secretions, ear ache,   fever, chills, sweats, unintended wt loss or wt gain, classically pleuritic or exertional cp,  orthopnea pnd or arm/hand swelling  or leg swelling, presyncope, palpitations, abdominal pain, anorexia, nausea, vomiting, diarrhea  or change in bowel habits or change in bladder habits, change in stools or change in urine, dysuria, hematuria,  rash, arthralgias, visual complaints, headache, numbness, weakness or ataxia or problems with walking or  coordination,  change in mood or  memory.        No outpatient medications have been marked as taking for the 10/03/19 encounter (Appointment) with 12/03/19, MD.              Objective:      10/03/2019           *** 08/15/2019        264        04/18/19 258 lb 14.4 oz (117.4 kg)  04/03/19 262 lb (118.8 kg)          Assessment

## 2019-10-03 NOTE — Patient Instructions (Addendum)
Add protonix 40 mg 30 min before supper  (or over the counter prilosec 20mg )   Continue omeprazole 40 mg Take 30-60 min before first meal of the day and eat earlier  Chlorpheniramine 4 mg one at bedtime automatically   Prednisone 10 mg take  4 each am x 2 days,   2 each am x 2 days,  1 each am x 2 days and stop   Gabapentin 100 mg three times daily with meals  Please remember to go to the lab department   for your tests - we will call you with the results when they are available.      Please schedule a follow up office visit in 4 weeks, sooner if needed

## 2019-10-03 NOTE — Progress Notes (Signed)
Travis Stewart, male    DOB: January 29, 1966,  MRN: [id number]   Brief patient profile:  50 yowm never smoker new onset nonproductive  cough mid Jan 2021 assoc with subjective wheeze and worse doe worse cough supine rx since 04/01/2019 referred to pulmonary clinic 04/03/2019 by Dr   [name].   History of Present Illness  04/03/2019  Pulmonary/ 1st office eval/Travis Stewart  Was maintained on prilosec 40 mg with breakfast  Chief Complaint  Patient presents with  . Pulmonary Consult    Referred by Dr. [name]. Pt c/o DOE x 2 months. He states he coughs all the time, worse when lies down and feels sputum in his throat.   Dyspnea:  MMRC1 = can walk nl pace, flat grade, can't hurry or go uphills or steps s sob   Cough: improved on prednisone Sleep: now hob up 60 degrees recliner whereas prev sleeping flat  SABA use: no  rec Finish prednisone as you  plan  Change  prilosec 40mg  to where you  Take 30-60 min before first meal of the day and Pepcid ac (famotidine) 20 mg one after supper  until cough is completely gone for at least a week without the need for cough suppression GERD  Diet   04/18/2019  f/u ov/Travis Stewart re: cough x jan 2021 / off pred x one week, no flare  Chief Complaint  Patient presents with  . Follow-up    cough is improved since 3/4 OV, does note occasional throat clearing.  Denies any DOE.    Dyspnea:  Not limited by breathing from desired activities  / very sedentary  Cough: better but still some dry cough mostly after supper then hs the worst with sense of ongoing tickle/ urge to clear throat  Sleeping: sometimes wakes up /supine with one pillow  SABA use: none 02: none  rec Increase the prilosec (omeprazole) to Take 30- 60 min before your first and last meals of the day  For drainage / throat tickle try take CHLORPHENIRAMINE  4 mg  (Chlortab 4mg   at Feb 2021     05/16/2019  f/u ov/Travis Stewart re: cough onset Jan 2021 almost completely gone  Chief Complaint  Patient presents  with  . Follow-up    Cough has improved some. No new co's. Would like to discuss the last cxr.   Dyspnea:  Not very active at all but no limiting sob Cough: much better with ppi bid and chlorpheniramine hs  Sleeping: on recliner for back  SABA use: none 02: none  rec Make sure you check your oxygen saturations at highest level of activity to be sure it stays well over 90% and call if losing ground with your exercise tolerance or your 02 saturations    08/15/2019  f/u ov/Travis Stewart re:  maint omeprazole 40 mg bid ac but not h1 / sedentary but no sob / vully vaccinated for covid 19  Chief Complaint  Patient presents with  . Follow-up  around 5/11/2021cough flared where had improved some and daily since   Dyspnea:  Not limited by breathing from desired activities  / not checking sats as rec  Cough: non productive Sleeping:worse cough at hs and keeps him up despite 30 degrees elevation hob / not using 1st gen H1 blockers per guidelines  / recs  SABA use: none  02: none  One course prednisone completed 5/29 much better then some subjective wheeze  Not aware of pnds / no gag/vomiting  rec Make sure you check  your oxygen saturations at highest level of activity to be sure it stays over 90% and check once a week  First take delsym two tsp every 12 hours and supplement if needed with  tramadol 50 mg up to 1-2 every 4 hours to suppress the urge to cough at all or even clear your throat. Swallowing water or using ice chips/non mint and menthol containing candies (such as lifesavers or sugarless jolly ranchers) are also effective.  You should rest your voice and avoid activities that you know make you cough. For drainage / throat tickle try take CHLORPHENIRAMINE  4 mg  (Chlortab 4mg   at should be easiest to find in the green box)  take one every 4 hours as needed - available over the counter- may cause drowsiness so start with just a bedtime dose or two and see how you tolerate it before  trying in daytime   Once you have eliminated the cough for 3 straight days try reducing the tramadol first,  then the delsym as tolerated.   Prednisone 10 mg take  4 each am x 2 days,   2 each am x 2 days,  1 each am x 2 days and stop (this is to eliminate allergies and inflammation from coughing) GERD diet  Please remember to go to the   x-ray department ([facility])  for your tests - we will call you with the results when they are available.              Objective:         10/03/2019          262  08/15/2019        264        04/18/19 258 lb 14.4 oz (117.4 kg)  04/03/19 262 lb (118.8 kg)      Anxious but pleasant amb wm with incessant throat clearing nad  Vital signs reviewed  10/03/2019  - Note at rest 02 sats  97% on RA   HEENT : pt wearing mask not removed for exam due to covid -19 concerns.    NECK :  without JVD/Nodes/TM/ nl carotid upstrokes bilaterally   LUNGS: no acc muscle use,  Nl contour chest which is clear to A and P bilaterally without cough on insp or exp maneuvers   CV:  RRR  no s3 or murmur or increase in P2, and no edema   ABD:  soft and nontender with nl inspiratory excursion in the supine position. No bruits or organomegaly appreciated, bowel sounds nl  MS:  Nl gait/ ext warm without deformities, calf tenderness, cyanosis or clubbing No obvious joint restrictions   SKIN: warm and dry without lesions    NEURO:  alert, approp, nl sensorium with  no motor or cerebellar deficits apparent.     Labs ordered 10/03/2019  :  allergy profile off pred x one month           Assessment

## 2019-10-07 LAB — IGE: IgE (Immunoglobulin E), Serum: 63 kU/L (ref <=114)

## 2019-10-07 NOTE — Progress Notes (Signed)
Left detailed msg on machine ok per DPR

## 2019-11-12 ENCOUNTER — Other Ambulatory Visit: Payer: Self-pay

## 2019-11-12 ENCOUNTER — Encounter: Payer: Self-pay | Admitting: Internal Medicine

## 2019-11-12 ENCOUNTER — Ambulatory Visit (INDEPENDENT_AMBULATORY_CARE_PROVIDER_SITE_OTHER): Payer: 59 | Admitting: Internal Medicine

## 2019-11-12 DIAGNOSIS — R053 Chronic cough: Secondary | ICD-10-CM

## 2019-11-12 DIAGNOSIS — R0600 Dyspnea, unspecified: Secondary | ICD-10-CM | POA: Diagnosis not present

## 2019-11-12 DIAGNOSIS — R0609 Other forms of dyspnea: Secondary | ICD-10-CM

## 2019-11-12 MED ORDER — PREDNISONE 10 MG PO TABS: ORAL_TABLET | ORAL | 0 refills | Status: AC

## 2019-11-12 MED ORDER — AMOXICILLIN-POT CLAVULANATE 875-125 MG PO TABS: 1.0000 | ORAL_TABLET | Freq: Two times a day (BID) | ORAL | 0 refills | Status: AC

## 2019-11-12 NOTE — Patient Instructions (Signed)
Augmentin 875 mg take one pill twice daily  X 10 days - take at breakfast and supper with large glass of water.  It would help reduce the usual side effects (diarrhea and yeast infections) if you ate cultured yogurt at lunch.   Prednisone 10 mg take  4 each am x 2 days,   2 each am x 2 days,  1 each am x 2 days and stop    We will call you with referral to ENT - take all medications with you  Return here after EMT eval if not better to your satisfaction - stay on all the other medications until 100% better.

## 2019-11-12 NOTE — Progress Notes (Signed)
Travis Stewart, male    DOB: 02/04/1965  MRN: [id number]   Brief patient profile:  71 yowm never smoker new onset nonproductive cough mid Jan 2021 assoc with subjective wheeze and worse doe worse cough supine rx since 04/01/2019 referred to pulmonary clinic 04/03/2019 by Dr   [name].   History of Present Illness  04/03/2019  Pulmonary/ 1st office eval/Travis Stewart  Was maintained on prilosec 40 mg with breakfast  Chief Complaint  Patient presents with  . Pulmonary Consult    Referred by Dr. [name]. Pt c/o DOE x 2 months. He states he coughs all the time, worse when lies down and feels sputum in his throat.   Dyspnea:  MMRC1 = can walk nl pace, flat grade, can't hurry or go uphills or steps s sob   Cough: improved on prednisone Sleep: now hob up 60 degrees recliner whereas prev sleeping flat  SABA use: no  rec Finish prednisone as you  plan  Change  prilosec 40mg  to where you  Take 30-60 min before first meal of the day and Pepcid ac (famotidine) 20 mg one after supper  until cough is completely gone for at least a week without the need for cough suppression GERD  Diet   04/18/2019  f/u ov/Travis Stewart re: cough x jan 2021 / off pred x one week, no flare  Chief Complaint  Patient presents with  . Follow-up    cough is improved since 3/4 OV, does note occasional throat clearing.  Denies any DOE.    Dyspnea:  Not limited by breathing from desired activities  / very sedentary  Cough: better but still some dry cough mostly after supper then hs the worst with sense of ongoing tickle/ urge to clear throat  Sleeping: sometimes wakes up /supine with one pillow  SABA use: none 02: none  rec Increase the prilosec (omeprazole) to Take 30- 60 min before your first and last meals of the day  For drainage / throat tickle try take CHLORPHENIRAMINE  4 mg  (Chlortab 4mg   at Feb 2021     05/16/2019  f/u ov/Travis Stewart re: cough onset Jan 2021 almost completely gone  Chief Complaint  Patient presents  with  . Follow-up    Cough has improved some. No new co's. Would like to discuss the last cxr.   Dyspnea:  Not very active at all but no limiting sob Cough: much better with ppi bid and chlorpheniramine hs  Sleeping: on recliner for back  SABA use: none 02: none  rec Make sure you check your oxygen saturations at highest level of activity to be sure it stays well over 90% and call if losing ground with your exercise tolerance or your 02 saturations    08/15/2019  f/u ov/Travis Stewart re:  maint omeprazole 40 mg bid ac but not h1 / sedentary but no sob / vully vaccinated for covid 19  Chief Complaint  Patient presents with  . Follow-up  around 5/11/2021cough flared where had improved some and daily since   Dyspnea:  Not limited by breathing from desired activities  / not checking sats as rec  Cough: non productive Sleeping:worse cough at hs and keeps him up despite 30 degrees elevation hob / not using 1st gen H1 blockers per guidelines  / recs  SABA use: none  02: none  One course prednisone completed 5/29 much better then some subjective wheeze  Not aware of pnds / no gag/vomiting  rec Make sure you check your  oxygen saturations at highest level of activity to be sure it stays over 90% and check once a week  First take delsym two tsp every 12 hours and supplement if needed with  tramadol 50 mg up to 1-2 every 4 hours to suppress the urge to cough at all or even clear your throat. Swallowing water or using ice chips/non mint and menthol containing candies (such as lifesavers or sugarless jolly ranchers) are also effective.  You should rest your voice and avoid activities that you know make you cough. For drainage / throat tickle try take CHLORPHENIRAMINE  4 mg   Once you have eliminated the cough for 3 straight days try reducing the tramadol first,  then the delsym as tolerated.   Prednisone 10 mg take  4 each am x 2 days,   2 each am x 2 days,  1 each am x 2 days and stop (this is to eliminate  allergies and inflammation from coughing) GERD diet  Please remember to go to the   x-ray department ([facility])  for your tests - we will call you with the results when they are available.    10/03/19 recs Add protonix 40 mg 30 min before supper  (or over the counter prilosec 20mg )  Continue omeprazole 40 mg Take 30-60 min before first meal of the day and eat earlier Chlorpheniramine 4 mg one at bedtime automatically  Prednisone 10 mg take  4 each am x 2 days,   2 each am x 2 days,  1 each am x 2 days and stop  Gabapentin 100 mg three times daily with meals   11/12/2019  f/u ov/Travis Stewart re:  uacs Chief Complaint  Patient presents with  . Follow-up  Dyspnea: walks for 15 min daily and sats upper 90s  Cough: worse since onset nasal congestion never seen ent now yellow mucus  Sleeping: more trouble due to whistling noise sleeping  SABA use: none  02: none    No obvious day to day or daytime variability or assoc excess/ purulent sputum or mucus plugs or hemoptysis or cp or chest tightness, subjective wheeze or overt   hb symptoms.     Also denies any obvious fluctuation of symptoms with weather or environmental changes or other aggravating or alleviating factors except as outlined above   No unusual exposure hx or h/o childhood pna/ asthma or knowledge of premature birth.  Current Allergies, Complete Past Medical History, Past Surgical History, Family History, and Social History were reviewed in 11/14/2019 record.  ROS  The following are not active complaints unless bolded Hoarseness, sore throat, dysphagia, dental problems, itching, sneezing,  nasal congestion or discharge of excess mucus or purulent secretions, ear ache,   fever, chills, sweats, unintended wt loss or wt gain, classically pleuritic or exertional cp,  orthopnea pnd or arm/hand swelling  or leg swelling, presyncope, palpitations, abdominal pain, anorexia, nausea, vomiting, diarrhea  or change in bowel  habits or change in bladder habits, change in stools or change in urine, dysuria, hematuria,  rash, arthralgias, visual complaints, headache, numbness, weakness or ataxia or problems with walking or coordination,  change in mood or  memory.        No outpatient medications have been marked as taking for the 11/12/19 encounter (Office Visit) with 11/14/19, MD.                Objective:    amb wm nad     11/12/2019  262  10/03/2019          262  08/15/2019        264        04/18/19 258 lb 14.4 oz (117.4 kg)  04/03/19 262 lb (118.8 kg)     Vital signs reviewed  11/12/2019  - Note at rest 02 sats  95% on RA   HEENT : pt wearing mask not removed for exam due to covid -19 concerns.    NECK :  without JVD/Nodes/TM/ nl carotid upstrokes bilaterally   LUNGS: no acc muscle use,  Nl contour chest which is clear to A and P bilaterally without cough on insp or exp maneuvers   CV:  RRR  no s3 or murmur or increase in P2, and no edema   ABD:  soft and nontender with nl inspiratory excursion in the supine position. No bruits or organomegaly appreciated, bowel sounds nl  MS:  Nl gait/ ext warm without deformities, calf tenderness, cyanosis or clubbing No obvious joint restrictions   SKIN: warm and dry without lesions    NEURO:  alert, approp, nl sensorium with  no motor or cerebellar deficits apparent.         Assessment     Outpatient Encounter Medications as of 11/12/2019  Medication Sig  . amoxicillin-clavulanate (AUGMENTIN) 875-125 MG tablet Take 1 tablet by mouth 2 (two) times daily for 10 days.  Marland Kitchen atorvastatin (LIPITOR) 20 MG tablet Take 20 mg by mouth daily.  Marland Kitchen gabapentin (NEURONTIN) 100 MG capsule Take 1 capsule (100 mg total) by mouth 3 (three) times daily. One three times daily  . Multiple Vitamin (ONE-A-DAY MENS PO) Take 1 tablet by mouth daily.  Marland Kitchen omeprazole (PRILOSEC) 40 MG capsule Take 30- 60 min before your first and last meals of the day  . pantoprazole  (PROTONIX) 40 MG tablet Take 1 tablet (40 mg total) by mouth daily. Take 30-60 min before last meal of the day  . predniSONE (DELTASONE) 10 MG tablet Take  4 each am x 2 days,   2 each am x 2 days,  1 each am x 2 days and stop  .

## 2019-11-13 ENCOUNTER — Encounter: Payer: Self-pay | Admitting: Internal Medicine

## 2019-11-13 NOTE — Assessment & Plan Note (Signed)
Onset Mid Jan 2021 while on ppi with bfast daily  - 04/03/2019 rx max gerd rx / pred taper off  - Allergy profile 04/18/2019 >  Eos 0.2 /  IgE 46 - worse since 06/10/19 > try cyclical cough protocol > temporary relief only  - 10/03/2019  :  allergy profile off pred x one month   Eos 0.2 /  IgE  63 - referred to ENT 11/12/2019   Clearly cough now related to pnds with ? Active sinusitis so rec augmentin x 10 days and pred x 6 days and f/u ent as does not appear to have allergy or asthma driving the cough          Each maintenance medication was reviewed in detail including emphasizing most importantly the difference between maintenance and prns and under what circumstances the prns are to be triggered using an action plan format where appropriate.  Total time for H and P, chart review, counseling, and generating customized AVS unique to this office visit / charting = 23 min

## 2019-11-13 NOTE — Assessment & Plan Note (Signed)
Onset Mid Jan 2021 with cough then sob with ? ILD on cxr  - Pred rx 04/01/19 >>> clinically improved  - 04/03/19 covid Antibodies neg  - 04/03/2019   Walked RA x two laps =  approx 565ft @ fast pace - stopped due to end of study sob   with sats of 94 % at the end of the study - cxr 04/18/2019 no evidence of ild, just low lung vol likely related to body habitus -  08/15/2019   Walked RA  3 laps @ approx 257ft each @ fast pace  stopped due to end of study,  no sob sats 98%   Resolved to his satisfaction, no further w/u needed

## 2019-12-24 ENCOUNTER — Other Ambulatory Visit: Payer: Self-pay | Admitting: Internal Medicine

## 2021-11-30 IMAGING — DX DG CHEST 2V
2 series · 2 of 2 positions shown · non-contrast
Comparison: None.

CLINICAL DATA: Chronic cough

EXAM:
CHEST - 2 VIEW

[chest pa]
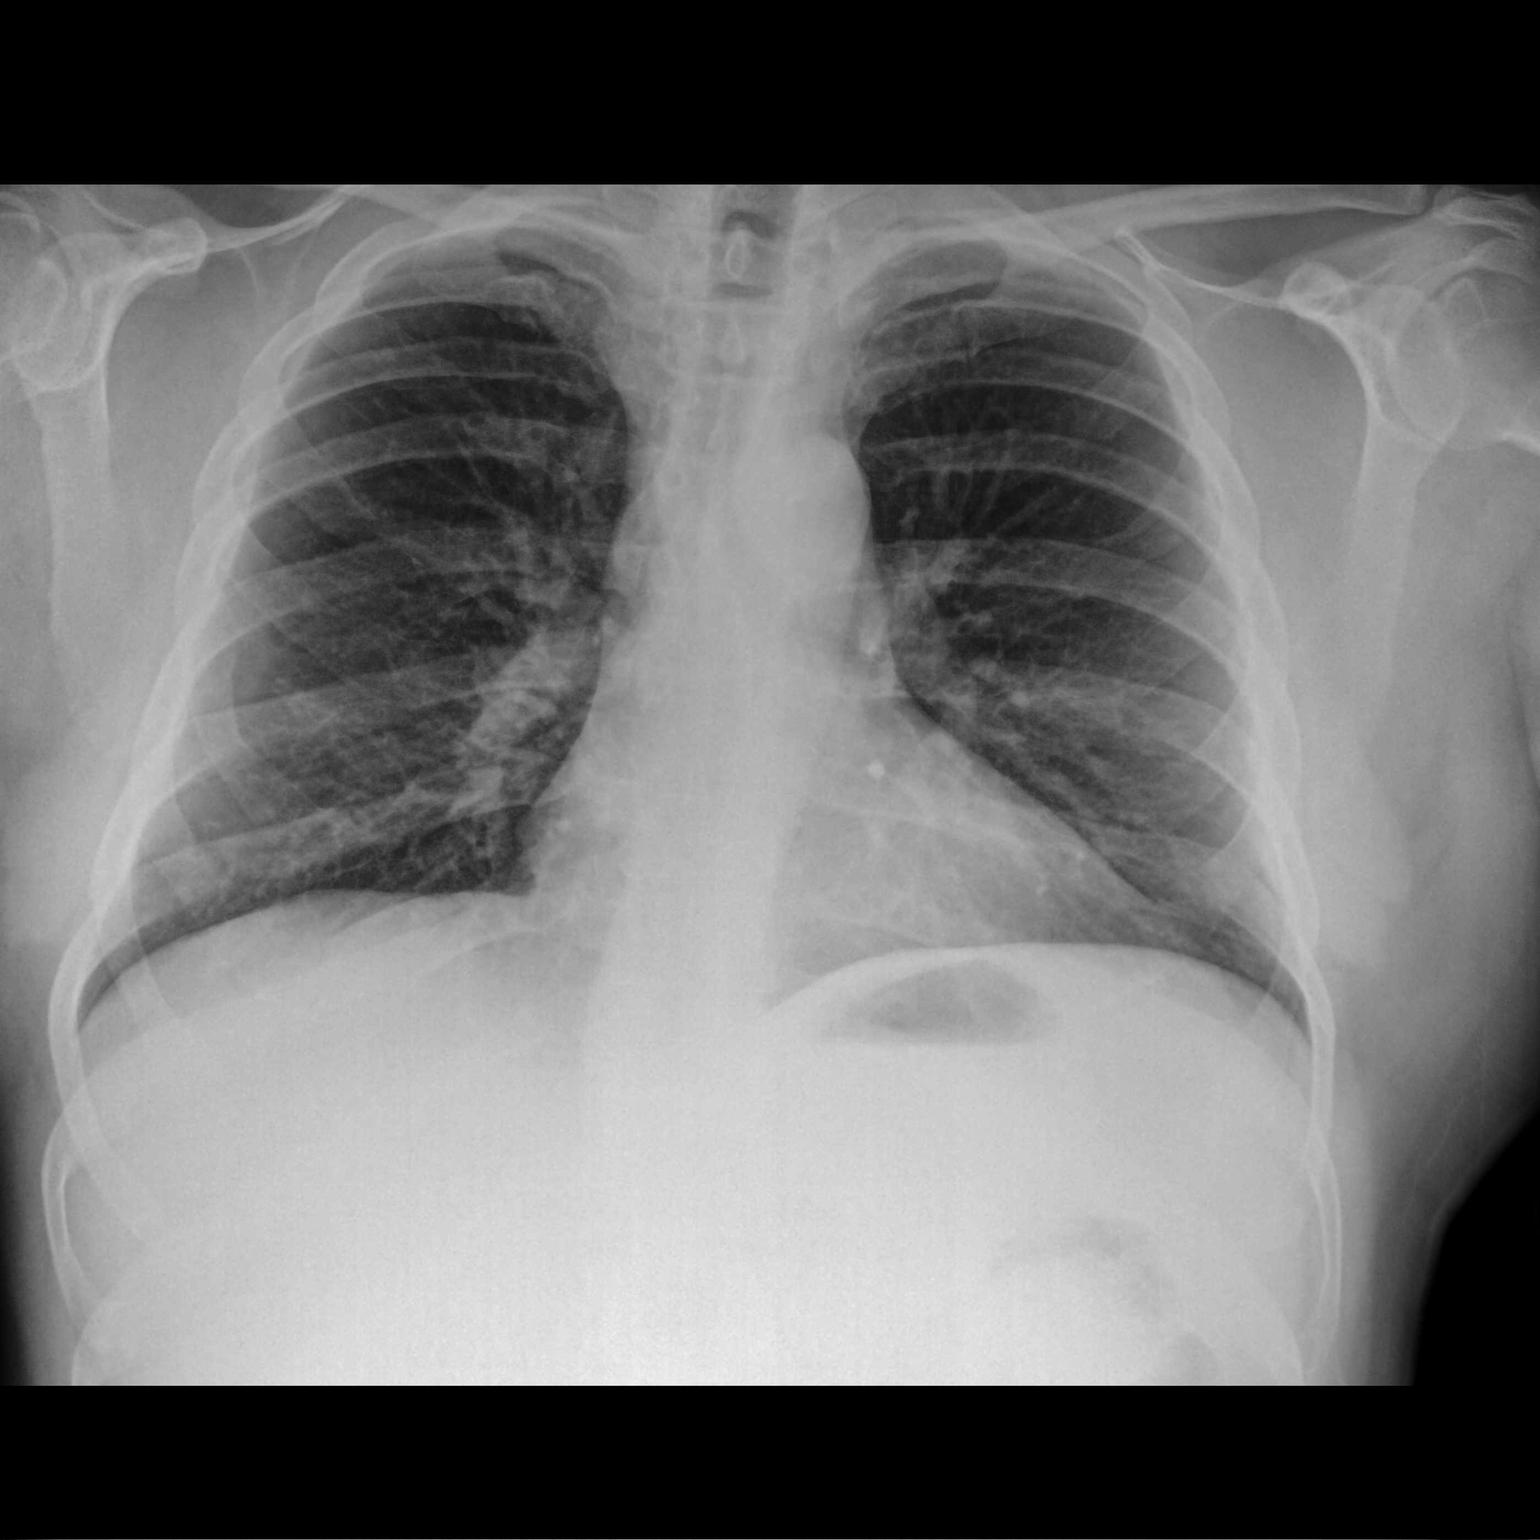

[chest lat]
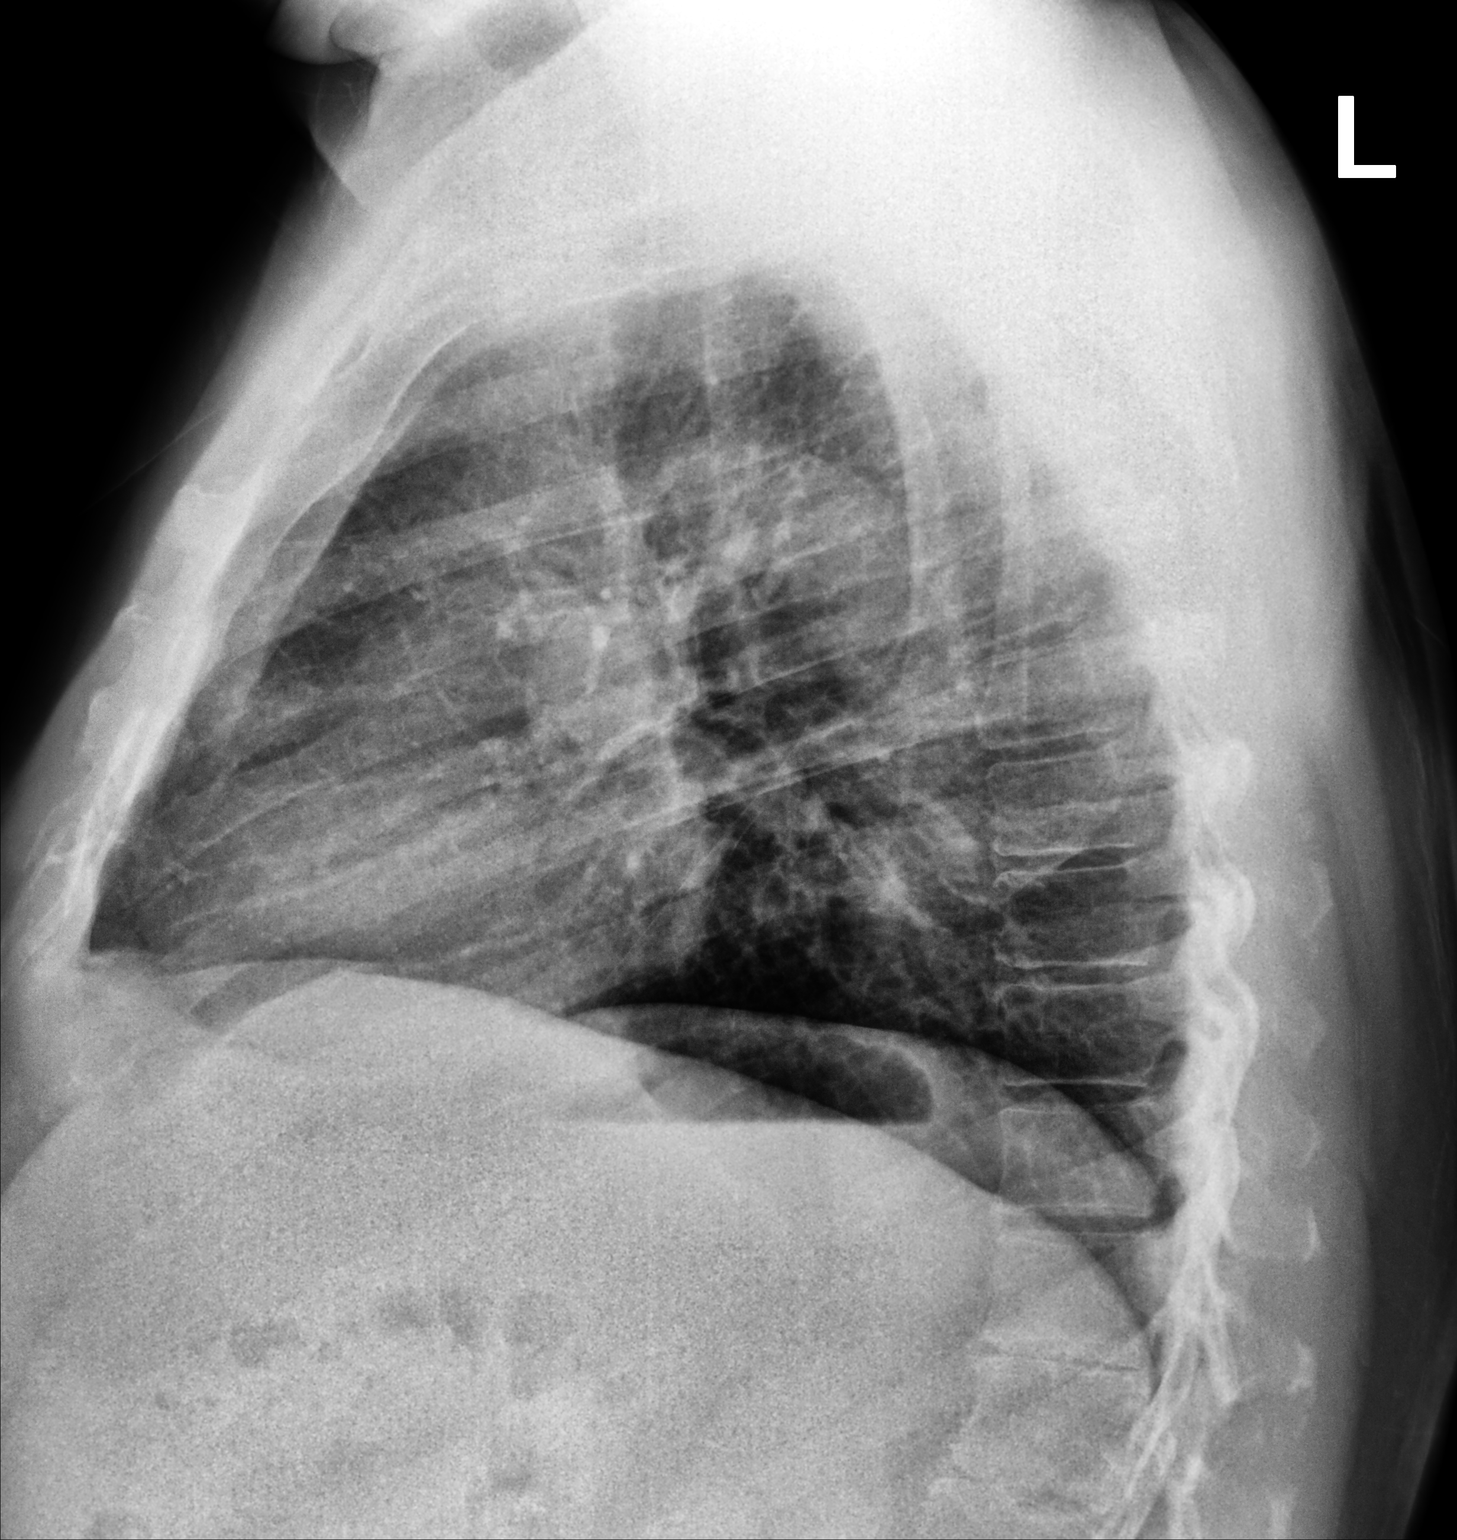

[2 of 2 positions shown; findings below may reference images not displayed]

FINDINGS: The heart size and mediastinal contours are within normal limits.
Both lungs are clear. The visualized skeletal structures are
unremarkable.
IMPRESSION: No active cardiopulmonary disease.

## 2022-03-29 IMAGING — DX DG CHEST 2V
2 series · 2 of 2 positions shown · non-contrast
Comparison: April 18, 2019

CLINICAL DATA: Cough and shortness of breath

EXAM:
CHEST - 2 VIEW

[chest pa]
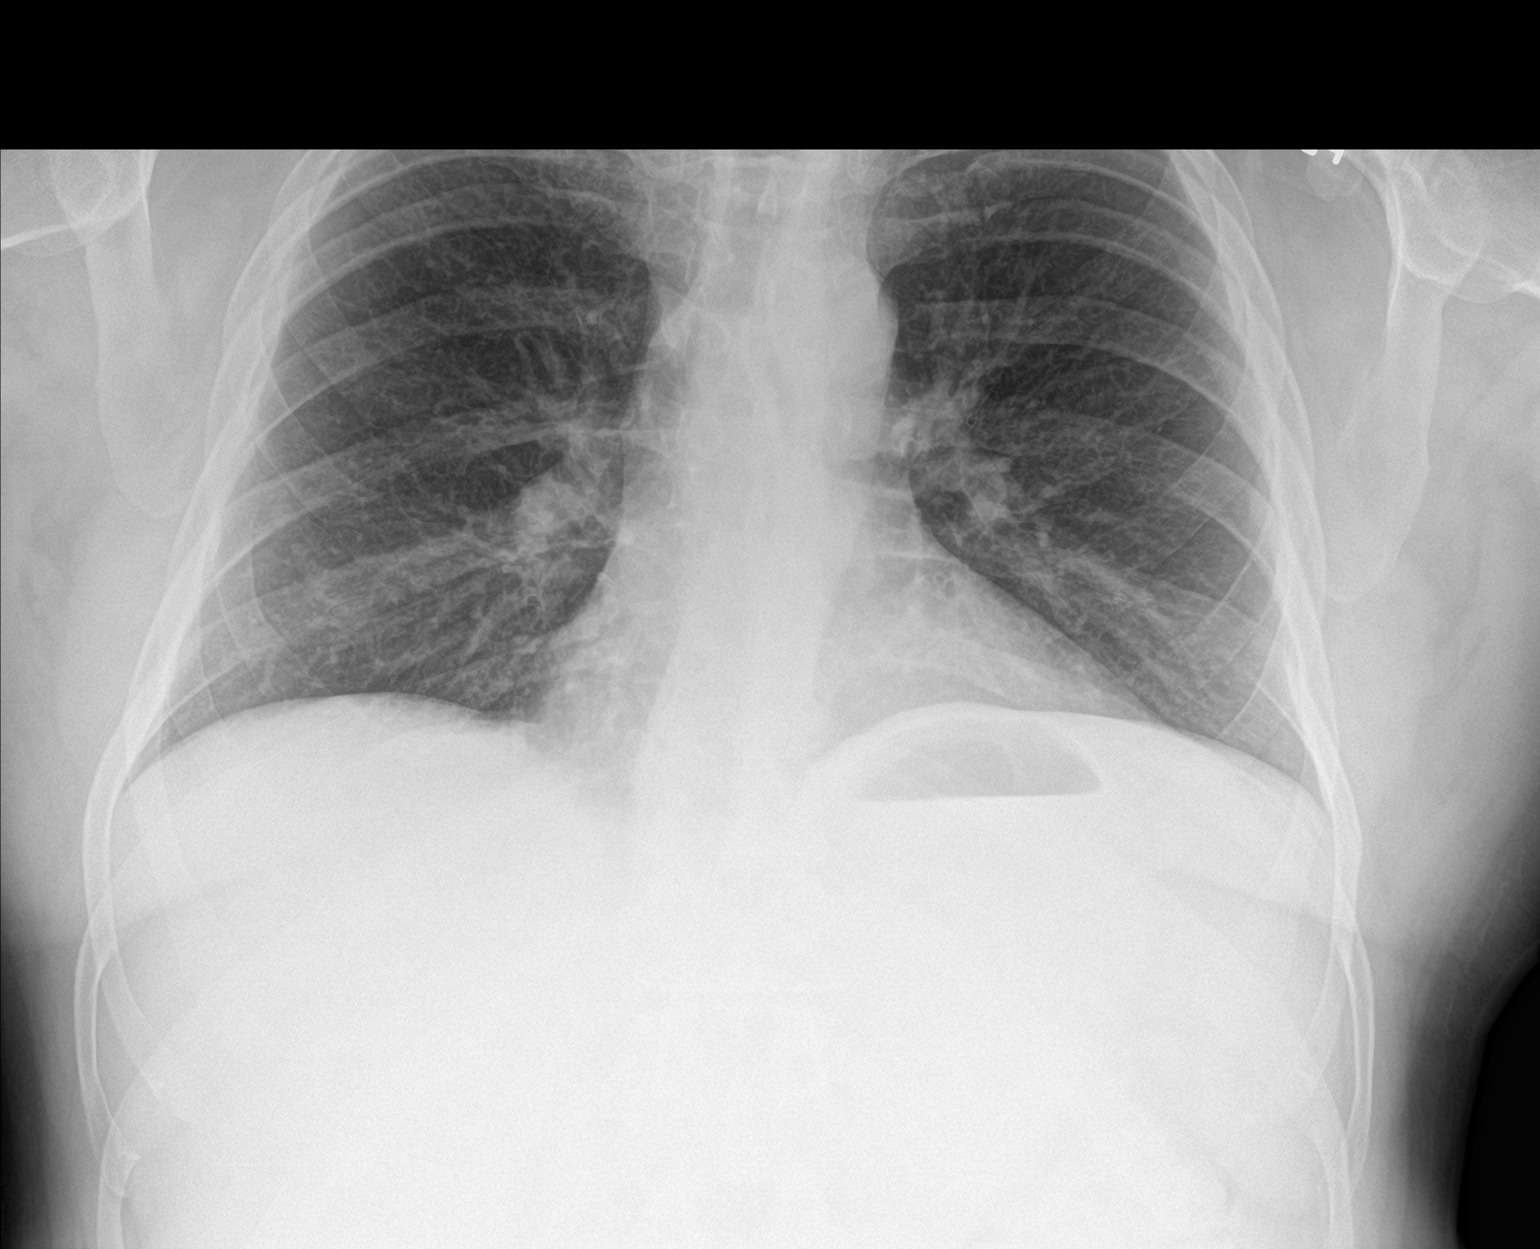

[chest lat]
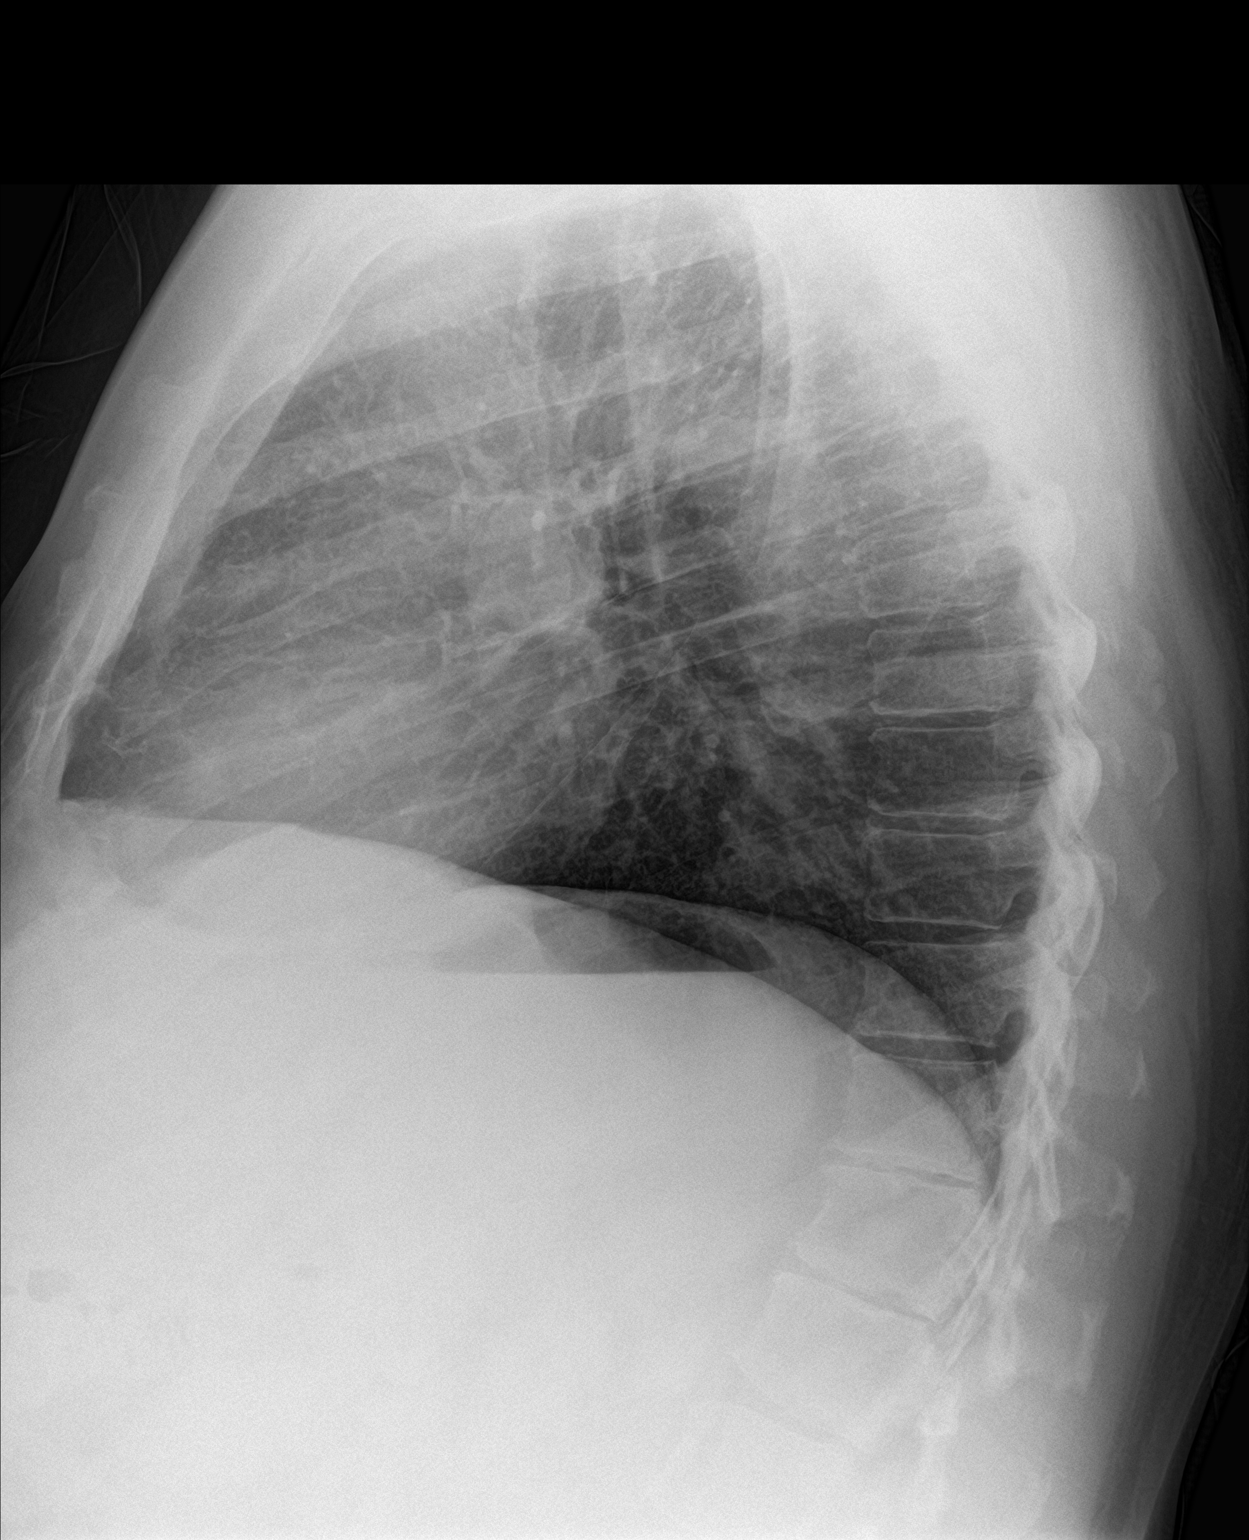

[2 of 2 positions shown; findings below may reference images not displayed]

FINDINGS: The cardiomediastinal silhouette is unchanged in contour. No pleural
effusion. No pneumothorax. No acute pleuroparenchymal abnormality.
Visualized abdomen is unremarkable. Multilevel degenerative changes
of the thoracolumbar spine. Unchanged minimal wedging of an inferior
thoracic vertebral body.
IMPRESSION: No acute cardiopulmonary abnormality.

## 2023-05-11 ENCOUNTER — Encounter: Payer: Self-pay | Admitting: Podiatry

## 2023-05-11 ENCOUNTER — Ambulatory Visit: Admitting: Podiatry

## 2023-05-11 DIAGNOSIS — Q828 Other specified congenital malformations of skin: Secondary | ICD-10-CM | POA: Diagnosis not present

## 2023-05-11 DIAGNOSIS — D689 Coagulation defect, unspecified: Secondary | ICD-10-CM | POA: Diagnosis not present

## 2023-05-11 DIAGNOSIS — T148XXA Other injury of unspecified body region, initial encounter: Secondary | ICD-10-CM

## 2023-05-11 DIAGNOSIS — M216X2 Other acquired deformities of left foot: Secondary | ICD-10-CM

## 2023-05-11 NOTE — Patient Instructions (Signed)
 More silicone and felt pads can be purchased from:  [email or web address]

## 2023-05-11 NOTE — Progress Notes (Signed)
 Chief Complaint  Patient presents with   Toe Injury    "I developed this blister from walking on the right foot."   N - blister on toe L - 4th right D - 1 month O - suddenly C - blisters, sore A - walking T - none   Callouses    "On the left foot, I have corn on the bottom of my foot." N - corn  L - 5th met left D - 3-4 weeks O - suddenly C - sore A - walking T - medicated corn pad, Epsom Salt soaks, file it     HPI: 58 y.o. male presents today with concern for blood blister to right fourth toe, developing painful callus underneath the left fifth metatarsal head.  Patient states that he was recently diagnosed with A-fib and he has been trying to ramp up his activity level and get more exercise at the recommendation of his cardiologist.  He is currently on Eliquis.  States that he went on a longer walk a couple weeks ago and developed a blister and he is wondering what he needs to do to manage this.  He noticed the left foot callus around the same time.  History reviewed. No pertinent past medical history.  History reviewed. No pertinent surgical history.  Allergies  Allergen Reactions   Cephalexin Diarrhea and Nausea And Vomiting    Patient complains of an upset stomach when he takes this medication   Doxycycline Other (See Comments)    Headaches  (REACTED A LONG TIME AGO.)   Zolpidem Other (See Comments)    SLEEP WALKING, SLEEP DRIVING OF CAR AND AMNESIA    ROS denies any nausea, vomiting, fever, chills, chest pain, shortness of breath   Physical Exam: There were no vitals filed for this visit.  General: The patient is alert and oriented x3 in no acute distress.  Dermatology: Stable blood blister noted to the right fourth toe plantar tuft.  No underlying wound or ulceration.  There is painful nucleated callus present left subfifth metatarsal head.  Vascular: Palpable pedal pulses bilaterally. Capillary refill within normal limits.  No appreciable edema.  No  erythema or calor.  Neurological: Light touch sensation grossly intact bilateral feet.   Musculoskeletal Exam: Prominent left fifth metatarsal head noted plantarly.  Assessment/Plan of Care: 1. Prominent metatarsal head of left foot   2. Blood blister   3. Porokeratosis   4. Coagulation defect (HCC)      No orders of the defined types were placed in this encounter.  None  Discussed clinical findings with patient today.  Right fourth toe blood blister and left subfifth met callus were sharply debrided using a 312 scalpel blade today as a courtesy without incident.  We discussed preventative and palliative care of these lesions including supportive and accommodative shoegear, padding, prefabricated and custom molded accommodative orthoses, use of a pumice stone and lotions/creams daily.  Reassured patient that this should improve over time as he gets used to increased ambulation again.  There is no significant deformity to the toe which would suggest blistering will be a long-term problem.  In the meantime did recommend offloading the left fifth metatarsal head callus using a reverse dancers pad.    Kiauna Zywicki L. Lunda Salines, AACFAS [facility]     [address]  [place], [place] [address]                Office [phone]  Fax [phone]

## 2023-08-27 ENCOUNTER — Ambulatory Visit (INDEPENDENT_AMBULATORY_CARE_PROVIDER_SITE_OTHER): Admitting: Podiatry

## 2023-08-27 DIAGNOSIS — D2372 Other benign neoplasm of skin of left lower limb, including hip: Secondary | ICD-10-CM | POA: Diagnosis not present

## 2023-09-04 NOTE — Progress Notes (Signed)
   Chief Complaint  Patient presents with   Callouses    Patient states that he has a Callous on left foot medial side, it is small but hurts really bad PT states, PT also states that he has a Callous on 4th toe right foot on the side of his right 4th toe, sometimes it runs black and into a blister. Patient states that he has been going thru this for about 6 months. No medication for pain.    Toe Pain    Subjective: 58 y.o. male presenting to the office today for evaluation of symptomatic skin lesions to the bilateral feet.   No past medical history on file.  No past surgical history on file.  Allergies  Allergen Reactions   Cephalexin Diarrhea and Nausea And Vomiting    Patient complains of an upset stomach when he takes this medication   Doxycycline Other (See Comments)    Headaches  (REACTED A LONG TIME AGO.)   Zolpidem Other (See Comments)    SLEEP WALKING, SLEEP DRIVING OF CAR AND AMNESIA     Objective:  Physical Exam General: Alert and oriented x3 in no acute distress  Dermatology: Hyperkeratotic lesion(s) present on the bilateral feet. Pain on palpation with a central nucleated core noted. Skin is warm, dry and supple bilateral lower extremities. Negative for open lesions or macerations.  Vascular: Palpable pedal pulses bilaterally. No edema or erythema noted. Capillary refill within normal limits.  Neurological: Grossly intact via light touch  Musculoskeletal Exam: Pain on palpation at the keratotic lesion(s) noted. Range of motion within normal limits bilateral. Muscle strength 5/5 in all groups bilateral.  Assessment: 1.  Eccrine poroma bilateral feet   Plan of Care:  -Patient evaluated -Excisional debridement of keratoic lesion(s) using a chisel blade was performed without incident.  -Salicylic acid applied with a bandaid -Return to the clinic PRN.   Thresa EMERSON Sar, DPM [facility]  Dr. Thresa EMERSON Sar, DPM    [address] [place], [place] [address]                Office [phone]  Fax [phone]

## 2023-11-20 ENCOUNTER — Ambulatory Visit: Admitting: Podiatry

## 2023-11-21 ENCOUNTER — Ambulatory Visit (INDEPENDENT_AMBULATORY_CARE_PROVIDER_SITE_OTHER): Admitting: Podiatry

## 2023-11-21 ENCOUNTER — Encounter: Payer: Self-pay | Admitting: Podiatry

## 2023-11-21 VITALS — Ht 72.0 in | Wt 262.0 lb

## 2023-11-21 DIAGNOSIS — D2372 Other benign neoplasm of skin of left lower limb, including hip: Secondary | ICD-10-CM

## 2023-11-21 NOTE — Progress Notes (Signed)
   Chief Complaint  Patient presents with   Callouses    Pt is here due to corn to the left foot, beginning to bother him, annoys him while walking.    Subjective: 59 y.o. male presenting to the office today for follow-up evaluation of symptomatic skin lesions to the bilateral feet.   No past medical history on file.  No past surgical history on file.  Allergies  Allergen Reactions   Cephalexin Diarrhea and Nausea And Vomiting    Patient complains of an upset stomach when he takes this medication   Doxycycline Other (See Comments)    Headaches  (REACTED A LONG TIME AGO.)   Zolpidem Other (See Comments)    SLEEP WALKING, SLEEP DRIVING OF CAR AND AMNESIA     Objective:  Physical Exam General: Alert and oriented x3 in no acute distress  Dermatology: Hyperkeratotic lesion(s) present on the bilateral feet. Pain on palpation with a central nucleated core noted. Skin is warm, dry and supple bilateral lower extremities. Negative for open lesions or macerations.  Vascular: Palpable pedal pulses bilaterally. No edema or erythema noted. Capillary refill within normal limits.  Neurological: Grossly intact via light touch  Musculoskeletal Exam: Pain on palpation at the keratotic lesion(s) noted. Range of motion within normal limits bilateral. Muscle strength 5/5 in all groups bilateral.  Assessment: 1.  Eccrine poroma bilateral feet   Plan of Care:  -Patient evaluated -Excisional debridement of keratoic lesion(s) using a chisel blade was performed without incident.  -Salicylic acid applied with a bandaid -We did discuss the possibility of application of Cantharone but for now the patient would like to hold off on this modality.  He will continue OTC salicylic acid -Return to the clinic PRN.   *works Radio producer for [employer]  Thresa EMERSON Sar, DPM [facility]  Dr. Thresa EMERSON Sar, DPM    [address] [place], [place] [address]                Office [phone]  Fax [phone]
